# Patient Record
Sex: Female | Born: 1960 | Race: White | Hispanic: No | Smoking: Former smoker
Health system: Southern US, Community
[De-identification: ages and names within clinical notes are randomized; demographics above are authoritative.]

## PROBLEM LIST (undated history)

## (undated) DIAGNOSIS — N200 Calculus of kidney: Secondary | ICD-10-CM

## (undated) DIAGNOSIS — R0789 Other chest pain: Secondary | ICD-10-CM

## (undated) DIAGNOSIS — R002 Palpitations: Secondary | ICD-10-CM

## (undated) DIAGNOSIS — E039 Hypothyroidism, unspecified: Secondary | ICD-10-CM

## (undated) DIAGNOSIS — R42 Dizziness and giddiness: Secondary | ICD-10-CM

## (undated) DIAGNOSIS — D34 Benign neoplasm of thyroid gland: Secondary | ICD-10-CM

## (undated) HISTORY — DX: Palpitations: R00.2

## (undated) HISTORY — PX: OTHER SURGICAL HISTORY: SHX169

## (undated) HISTORY — DX: Benign neoplasm of thyroid gland: D34

## (undated) HISTORY — DX: Dizziness and giddiness: R42

## (undated) HISTORY — DX: Other chest pain: R07.89

## (undated) HISTORY — DX: Hypothyroidism, unspecified: E03.9

## (undated) HISTORY — PX: BREAST CYST ASPIRATION: SHX578

## (undated) HISTORY — DX: Calculus of kidney: N20.0

---

## 1998-06-11 ENCOUNTER — Ambulatory Visit (HOSPITAL_COMMUNITY): Admission: RE | Admit: 1998-06-11 | Discharge: 1998-06-11 | Payer: Self-pay | Admitting: *Deleted

## 1998-06-11 ENCOUNTER — Encounter: Payer: Self-pay | Admitting: *Deleted

## 2000-05-04 ENCOUNTER — Encounter: Payer: Self-pay | Admitting: *Deleted

## 2000-05-04 ENCOUNTER — Ambulatory Visit (HOSPITAL_COMMUNITY): Admission: RE | Admit: 2000-05-04 | Discharge: 2000-05-04 | Payer: Self-pay | Admitting: *Deleted

## 2000-05-21 ENCOUNTER — Encounter: Payer: Self-pay | Admitting: *Deleted

## 2000-05-21 ENCOUNTER — Encounter: Admission: RE | Admit: 2000-05-21 | Discharge: 2000-05-21 | Payer: Self-pay | Admitting: *Deleted

## 2000-12-23 ENCOUNTER — Other Ambulatory Visit: Admission: RE | Admit: 2000-12-23 | Discharge: 2000-12-23 | Payer: Self-pay | Admitting: *Deleted

## 2000-12-23 ENCOUNTER — Encounter (INDEPENDENT_AMBULATORY_CARE_PROVIDER_SITE_OTHER): Payer: Self-pay

## 2001-04-18 ENCOUNTER — Other Ambulatory Visit: Admission: RE | Admit: 2001-04-18 | Discharge: 2001-04-18 | Payer: Self-pay | Admitting: *Deleted

## 2001-05-23 ENCOUNTER — Ambulatory Visit (HOSPITAL_COMMUNITY): Admission: RE | Admit: 2001-05-23 | Discharge: 2001-05-23 | Payer: Self-pay | Admitting: *Deleted

## 2001-05-23 ENCOUNTER — Encounter: Payer: Self-pay | Admitting: *Deleted

## 2002-07-10 ENCOUNTER — Inpatient Hospital Stay (HOSPITAL_COMMUNITY): Admission: AD | Admit: 2002-07-10 | Discharge: 2002-07-13 | Payer: Self-pay | Admitting: *Deleted

## 2002-07-26 ENCOUNTER — Encounter: Admission: RE | Admit: 2002-07-26 | Discharge: 2002-08-25 | Payer: Self-pay | Admitting: *Deleted

## 2002-08-26 ENCOUNTER — Encounter: Admission: RE | Admit: 2002-08-26 | Discharge: 2002-09-25 | Payer: Self-pay | Admitting: *Deleted

## 2002-10-26 ENCOUNTER — Encounter: Admission: RE | Admit: 2002-10-26 | Discharge: 2002-11-25 | Payer: Self-pay | Admitting: *Deleted

## 2002-11-16 ENCOUNTER — Other Ambulatory Visit: Admission: RE | Admit: 2002-11-16 | Discharge: 2002-11-16 | Payer: Self-pay | Admitting: *Deleted

## 2003-05-02 ENCOUNTER — Encounter: Payer: Self-pay | Admitting: Obstetrics & Gynecology

## 2003-05-02 ENCOUNTER — Ambulatory Visit (HOSPITAL_COMMUNITY): Admission: RE | Admit: 2003-05-02 | Discharge: 2003-05-02 | Payer: Self-pay | Admitting: Obstetrics & Gynecology

## 2003-12-04 ENCOUNTER — Ambulatory Visit (HOSPITAL_COMMUNITY): Admission: RE | Admit: 2003-12-04 | Discharge: 2003-12-04 | Payer: Self-pay | Admitting: Obstetrics & Gynecology

## 2003-12-06 ENCOUNTER — Encounter (INDEPENDENT_AMBULATORY_CARE_PROVIDER_SITE_OTHER): Payer: Self-pay | Admitting: Specialist

## 2003-12-06 ENCOUNTER — Inpatient Hospital Stay (HOSPITAL_COMMUNITY): Admission: RE | Admit: 2003-12-06 | Discharge: 2003-12-12 | Payer: Self-pay | Admitting: Obstetrics

## 2003-12-13 ENCOUNTER — Encounter: Admission: RE | Admit: 2003-12-13 | Discharge: 2004-01-12 | Payer: Self-pay | Admitting: Obstetrics

## 2004-01-13 ENCOUNTER — Encounter: Admission: RE | Admit: 2004-01-13 | Discharge: 2004-02-12 | Payer: Self-pay | Admitting: Obstetrics

## 2004-03-14 ENCOUNTER — Encounter: Admission: RE | Admit: 2004-03-14 | Discharge: 2004-04-13 | Payer: Self-pay | Admitting: Obstetrics

## 2004-04-14 ENCOUNTER — Encounter: Admission: RE | Admit: 2004-04-14 | Discharge: 2004-05-14 | Payer: Self-pay | Admitting: Obstetrics

## 2004-05-29 ENCOUNTER — Ambulatory Visit (HOSPITAL_COMMUNITY): Admission: RE | Admit: 2004-05-29 | Discharge: 2004-05-29 | Payer: Self-pay | Admitting: Obstetrics

## 2004-06-14 ENCOUNTER — Encounter: Admission: RE | Admit: 2004-06-14 | Discharge: 2004-07-14 | Payer: Self-pay | Admitting: Obstetrics

## 2004-09-22 ENCOUNTER — Encounter: Admission: RE | Admit: 2004-09-22 | Discharge: 2004-09-22 | Payer: Self-pay | Admitting: Obstetrics

## 2005-03-02 ENCOUNTER — Ambulatory Visit (HOSPITAL_COMMUNITY): Admission: RE | Admit: 2005-03-02 | Discharge: 2005-03-02 | Payer: Self-pay | Admitting: Family Medicine

## 2005-04-30 ENCOUNTER — Other Ambulatory Visit: Admission: RE | Admit: 2005-04-30 | Discharge: 2005-04-30 | Payer: Self-pay | Admitting: Obstetrics and Gynecology

## 2005-10-11 ENCOUNTER — Emergency Department (HOSPITAL_COMMUNITY): Admission: EM | Admit: 2005-10-11 | Discharge: 2005-10-11 | Payer: Self-pay | Admitting: Emergency Medicine

## 2006-02-04 ENCOUNTER — Encounter: Admission: RE | Admit: 2006-02-04 | Discharge: 2006-02-04 | Payer: Self-pay | Admitting: Obstetrics and Gynecology

## 2006-05-06 ENCOUNTER — Other Ambulatory Visit: Admission: RE | Admit: 2006-05-06 | Discharge: 2006-05-06 | Payer: Self-pay | Admitting: Obstetrics and Gynecology

## 2006-10-27 ENCOUNTER — Ambulatory Visit (HOSPITAL_COMMUNITY): Admission: RE | Admit: 2006-10-27 | Discharge: 2006-10-27 | Payer: Self-pay | Admitting: Family Medicine

## 2006-11-04 ENCOUNTER — Encounter (INDEPENDENT_AMBULATORY_CARE_PROVIDER_SITE_OTHER): Payer: Self-pay | Admitting: Specialist

## 2006-11-04 ENCOUNTER — Other Ambulatory Visit: Admission: RE | Admit: 2006-11-04 | Discharge: 2006-11-04 | Payer: Self-pay | Admitting: Interventional Radiology

## 2006-11-04 ENCOUNTER — Encounter: Admission: RE | Admit: 2006-11-04 | Discharge: 2006-11-04 | Payer: Self-pay | Admitting: Family Medicine

## 2007-02-07 ENCOUNTER — Encounter: Admission: RE | Admit: 2007-02-07 | Discharge: 2007-02-07 | Payer: Self-pay | Admitting: Obstetrics and Gynecology

## 2007-02-28 ENCOUNTER — Encounter: Admission: RE | Admit: 2007-02-28 | Discharge: 2007-02-28 | Payer: Self-pay | Admitting: Endocrinology

## 2007-05-20 ENCOUNTER — Other Ambulatory Visit: Admission: RE | Admit: 2007-05-20 | Discharge: 2007-05-20 | Payer: Self-pay | Admitting: Obstetrics and Gynecology

## 2007-08-18 ENCOUNTER — Encounter: Admission: RE | Admit: 2007-08-18 | Discharge: 2007-08-18 | Payer: Self-pay | Admitting: Endocrinology

## 2008-02-14 ENCOUNTER — Encounter: Admission: RE | Admit: 2008-02-14 | Discharge: 2008-02-14 | Payer: Self-pay | Admitting: Obstetrics and Gynecology

## 2008-05-21 ENCOUNTER — Other Ambulatory Visit: Admission: RE | Admit: 2008-05-21 | Discharge: 2008-05-21 | Payer: Self-pay | Admitting: Obstetrics and Gynecology

## 2008-07-06 ENCOUNTER — Encounter: Admission: RE | Admit: 2008-07-06 | Discharge: 2008-07-06 | Payer: Self-pay | Admitting: Internal Medicine

## 2009-02-18 ENCOUNTER — Encounter: Admission: RE | Admit: 2009-02-18 | Discharge: 2009-02-18 | Payer: Self-pay | Admitting: Obstetrics and Gynecology

## 2009-06-11 ENCOUNTER — Other Ambulatory Visit: Admission: RE | Admit: 2009-06-11 | Discharge: 2009-06-11 | Payer: Self-pay | Admitting: Obstetrics and Gynecology

## 2010-02-19 ENCOUNTER — Encounter: Admission: RE | Admit: 2010-02-19 | Discharge: 2010-02-19 | Payer: Self-pay | Admitting: Obstetrics and Gynecology

## 2010-07-02 ENCOUNTER — Encounter
Admission: RE | Admit: 2010-07-02 | Discharge: 2010-07-02 | Payer: Self-pay | Source: Home / Self Care | Admitting: Endocrinology

## 2010-11-13 ENCOUNTER — Encounter: Payer: Self-pay | Admitting: Cardiovascular Disease

## 2010-11-14 ENCOUNTER — Encounter: Payer: Self-pay | Admitting: Cardiovascular Disease

## 2010-11-14 ENCOUNTER — Ambulatory Visit (INDEPENDENT_AMBULATORY_CARE_PROVIDER_SITE_OTHER): Payer: BC Managed Care – PPO | Admitting: Cardiovascular Disease

## 2010-11-14 DIAGNOSIS — R079 Chest pain, unspecified: Secondary | ICD-10-CM | POA: Insufficient documentation

## 2010-11-14 DIAGNOSIS — R002 Palpitations: Secondary | ICD-10-CM

## 2010-11-14 NOTE — Patient Instructions (Signed)
Your physician has requested that you have an echocardiogram. Echocardiography is a painless test that uses sound waves to create images of your heart. It provides your doctor with information about the size and shape of your heart and how well your heart's chambers and valves are working. This procedure takes approximately one hour. There are no restrictions for this procedure.  Your physician has requested that you have an exercise tolerance test. For further information please visit [email or web address]. Please also follow instruction sheet, as given.   

## 2010-11-14 NOTE — Assessment & Plan Note (Signed)
Atypical chest pain. EKG is normal. She also has dizziness. She would like to start HRT. Will arrange echo and exercise treadmill stress test.

## 2010-11-14 NOTE — Progress Notes (Signed)
History of Present Illness:50 yo female with history of hypothyroidism, thyroid cysts, GERD here today for cardiac evaluation. She has no prior cardiac issues. She is going through menopause. She has occasional episodes of dizziness. She was a smoker for 20 years. She currently smokes several cigarettes per week. Her husband recently had a bad accident and has a spinal cord injury. She also has episodes of flushing, chest pressure and palpitations. Her chest pressure is centrally located and does not radiate. No associated symptoms. 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 She is also having symptoms of menopause and has recently discussed HRT with her primary care provider.   Past Medical History  Diagnosis Date  . Dizziness   . Chest pressure   . Palpitations   . Hypothyroidism     No past surgical history on file.  Current Outpatient Prescriptions  Medication Sig Dispense Refill  . calcium carbonate 200 MG capsule Take 250 mg by mouth daily.        . fish oil-omega-3 fatty acids 1000 MG capsule Take 1 g by mouth daily.        . Multiple Vitamin (MULTIVITAMIN) tablet Take 1 tablet by mouth daily.        . pantoprazole (PROTONIX) 40 MG tablet Take 40 mg by mouth daily.          No Known Allergies  History   Social History  . Marital Status: Married    Spouse Name: N/A    Number of Children: N/A  . Years of Education: N/A   Occupational History  . Not on file.   Social History Main Topics  . Smoking status: Former Games developer  . Smokeless tobacco: Not on file   Comment: smoke occasionally  . Alcohol Use: Not on file  . Drug Use: Not on file  . Sexually Active: Not on file   Other Topics Concern  . Not on file   Social History Narrative  . No narrative on file    No family history on file.  Review of Systems:  As stated in the HPI and otherwise negative.   BP 110/70  Pulse 65  Resp 14  Ht 5\' 6"  (1.676 m)  Wt 130 lb (58.968 kg)  BMI 20.98 kg/m2  Physical  Examination: General: Well developed, well nourished, NAD HEENT: OP clear, mucus membranes moist SKIN: warm, dry. No rashes. Neuro: No focal deficits Musculoskeletal: Muscle strength 5/5 all ext Psychiatric: Mood and affect normal Neck: No JVD, no carotid bruits, no thyromegaly, no lymphadenopathy. Lungs:Clear bilaterally, no wheezes, rhonci, crackles Cardiovascular: Regular rate and rhythm. No murmurs, gallops or rubs. Abdomen:Soft. Bowel sounds present. Non-tender.  Extremities: No lower extremity edema. Pulses are 2 + in the bilateral DP/PT.  EKG:NSR rate 65 bpm. Normal EKG.

## 2010-11-25 ENCOUNTER — Ambulatory Visit (INDEPENDENT_AMBULATORY_CARE_PROVIDER_SITE_OTHER): Payer: BC Managed Care – PPO | Admitting: Cardiovascular Disease

## 2010-11-25 ENCOUNTER — Ambulatory Visit (HOSPITAL_COMMUNITY): Payer: BC Managed Care – PPO | Attending: Cardiovascular Disease | Admitting: Radiology

## 2010-11-25 DIAGNOSIS — R079 Chest pain, unspecified: Secondary | ICD-10-CM | POA: Insufficient documentation

## 2010-11-25 DIAGNOSIS — R072 Precordial pain: Secondary | ICD-10-CM

## 2010-11-25 DIAGNOSIS — R002 Palpitations: Secondary | ICD-10-CM | POA: Insufficient documentation

## 2010-11-25 NOTE — Progress Notes (Signed)
Exercise Treadmill Test  Pre-Exercise Testing Evaluation Rhythm: sinus bradycardia  Rate: 52   PR:  .13 QRS:  .08  QT:  .45 QTc: .42     Test  Exercise Tolerance Test Ordering MD: Melene Muller, MD  Interpreting MD:  Melene Muller, MD  Unique Test No: 1  Treadmill:  1  Indication for ETT: chest pain - rule out ischemia  Contraindication to ETT: No   Stress Modality: exercise - treadmill  Cardiac Imaging Performed: non   Protocol: standard Bruce - maximal  Max BP:  165/78  Max MPHR (bpm):  173 85% MPR (bpm):  147  155 % MPHR obtained:  89  Reached 85% MPHR (min:sec): 9:07 Total Exercise Time (min-sec):10:30  Workload in METS:  12.5 Borg Scale:17  Reason ETT Terminated:  fatigue    ST Segment Analysis At Rest: normal ST segments - no evidence of significant ST depression With Exercise: no evidence of significant ST depression  Other Information Arrhythmia:  Yes Angina during ETT:  absent (0) Quality of ETT:  non-diagnostic  ETT Interpretation:  normal - no evidence of ischemia by ST analysis  Comments: Pt exercised for 10:30 minutes with no chest pain. Rare PVCs with exercise. No ischemic EKG changes.   Recommendations: Normal stress test.  Echo with normal LV function. No further cardiac workup indicated. Proceed with hormone replacement therapy if indicated.

## 2010-12-12 NOTE — Discharge Summary (Signed)
NAME:  Yvonne Strickland, Yvonne Strickland                       ACCOUNT NO.:  [id number]   MEDICAL RECORD NO.:  [id number]                   PATIENT TYPE:  INP   LOCATION:  9108                                 FACILITY:  [facility]   PHYSICIAN:  Charles A. Clearance Coots, M.D.             DATE OF BIRTH:  21-Jun-1961   DATE OF ADMISSION:  12/06/2003  DATE OF DISCHARGE:  12/12/2003                                 DISCHARGE SUMMARY   ADMITTING DIAGNOSES:  1. Thirty-nine weeks gestation.  2. Breech presentation.  3. Desires permanent sterilization at time of cesarean section.   DISCHARGE DIAGNOSES:  1. Postoperative day #6, status post cesarean delivery for a viable female     newborn.  2. Bilateral tubal ligation.  3. Acute blood loss anemia, status post postoperative bleed from tubal     ligation.   PATIENT PRESENTATION:  The patient presented to the [facility] on Dec 06, 2003 for cesarean section to the preop unit.  The patient's cesarean  section indication was for a breech presentation at 39 weeks.  The patient  also desired bilateral tubal ligation at the time of cesarean delivery for  permanent sterilization.   PATIENT HISTORY:  The patient is a 50 year old, gravida 4, para 1-0-2-1,  with a last menstrual period of March 03, 2003, with a due date of Dec 02, 2003, Dec 08, 2003 final [facility].   ALLERGIES:  The patient has no known drug allergies.   CURRENT MEDICATIONS:  Prenatal vitamins once daily.   PAST SURGICAL HISTORY:  The patient has no history of surgery.   The patient has been treated in the past for Chlamydia in 1986.  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   PRENATAL COURSE:  The patient has received prenatal care at the [facility] since September of 2004.  The patient is A positive.  Antibody screen is negative.  Rubella titer is immune.  Hepatitis is  negative.  HIV is nonreactive.  The patient has a positive RPR with a  negative TPPA.  The  patient is at risk during her pregnancy due to advanced  maternal age at 50 years of age.  The patient received genetic counseling.  Elected not to proceed with an amniocentesis.  All genetic testing conducted  at [facility] within normal limits.   HOSPITAL COURSE:  The patient had a cesarean section on Dec 06, 2003.  Please see operative note by Dr. Coral Ceo.   On postoperative day #1, Dec 07, 2003, the patient complained of pain in the  right shoulder and significant bruising on the abdomen.  The patient is  noted to have significant acute blood loss anemia with a hemoglobin of now  5.9.  Preoperative hemoglobin was 12.9, with a hematocrit of 38.5, and a  platelet count of 164.  The patient's abdomen is soft, mildly distended,  with a large area of ecchymosis.  The patient has positive perineal  tenderness and pain into her right shoulder.   On postoperative day #2, Dec 08, 2003, evaluation by Dr. Clearance Coots for  postoperative tubal bleeding from tubal ligation site, with noted hematoma.  The patient is having serial hemoglobins at this point, with a hemoglobin  today of 5.6.  The patient is hematologically stable, is not orthostatic,  and is asymptomatic.  The patient is taking analgesia for postoperative  pain, and for shoulder discomfort.   On postoperative day #3, the patient is hemodynamically stable.  No  complaints of dizziness with activity.  Hemoglobin is otherwise to 4.8.  Evaluation by Dr. Clearance Coots, and discussion with patient and family, to  consider blood transfusion or return to the surgical suite for exploration  of fossal hematoma if hemoglobin continues to drop from this point.  Otherwise, the patient is progressing well postoperatively.  She is  tolerating p.o. intake without any difficulty.  She is voiding without  difficulty, passing gas, and has had a positive bowel movement.   On postoperative day #4, hemoglobin is 5.2.  The patient has reached   equilibrium with her hemoglobin.  The patient still has marked ecchymosis  over the majority of abdomen and into the mons and labia.  Otherwise, the  patient reports no increase in pain, and is in stable condition to continue  observation of hemoglobin.  Will defer transfusion or exploratory surgery at  this point.   On postoperative day #5, hemoglobin is now 5.4, is stable.  The patient is  tolerating diet without any problems.  The patient is now also taking iron  three times a day.  Evaluate for discharge tomorrow if hemoglobin remains  stable.   On postoperative day #6, hemoglobin is now 7.1 with a hematocrit of 20.9,  and a platelet count of 358.  The patient is up ad lib and asymptomatic.  Incision is well approximated with subcuticular suture.  No drainage is  present.  The patient is discharged home in stable condition to continue  rest at home.   FOLLOW UP:  To follow up in the office in 2 days to repeat hemoglobin.  The  patient is to call with any dizziness, increase in bleeding, or symptoms.   MEDICATIONS FOR DISCHARGE:  1. Darvocet-N 100 q.4h. as needed for discomfort.  2. Vicodin q.6-8h. as needed for pain unrelieved with Darvocet.  3. Prenatal vitamin is Prenate Elite one tablet daily.  4. Niferex-150 one tablet twice a day.  5. Colace 100 mg one tablet daily to prevent constipation.   DISCHARGE INSTRUCTIONS:  1. The patient to air dry incision often.  2. Ice to labia for comfort and bruising.  3. The patient to rest at home.  4. No heavy lifting.  5. To return to the [facility] as scheduled in approximately 2     days.   DIET:  The patient is on a regular diet.   WOUND CARE:  Routine.     Marlinda Mike, C.N.M.                      Charles A. Clearance Coots, M.D.   TB/MEDQ  D:  01/23/2004  T:  01/23/2004  Job:  [id number]

## 2010-12-12 NOTE — Op Note (Signed)
NAME:  Yvonne Strickland, Yvonne Strickland                       ACCOUNT NO.:  [id number]   MEDICAL RECORD NO.:  [id number]                   PATIENT TYPE:  INP   LOCATION:  9108                                 FACILITY:  [facility]   PHYSICIAN:  Charles A. Clearance Coots, M.D.             DATE OF BIRTH:  10-Mar-1961   DATE OF PROCEDURE:  12/06/2003  DATE OF DISCHARGE:                                 OPERATIVE REPORT   PREOPERATIVE DIAGNOSES:  1. At [redacted] weeks gestation.  2. Breech presentation.  3. Desires sterilization.   POSTOPERATIVE DIAGNOSES:  1. At [redacted] weeks gestation.  2. Breech presentation.  3. Desires sterilization.   PROCEDURE:  Primary low transverse cesarean section, bilateral partial  salpingectomy (Pomeroy technique).   SURGEON:  Charles A. Clearance Coots, M.D.   ASSISTANT:  Charlsie Merles.   ANESTHESIA:  Spinal.   ESTIMATED BLOOD LOSS:  800 mL.   IV FLUIDS:  3700 mL.   URINE OUT:  350 mL clear.   COMPLICATIONS:  None.   Foley catheter to gravity.   FINDINGS:  Viable female at 0906, Apgars of 8 at one minute and 9 at five  minutes.  Normal uterus, ovaries and fallopian tubes.   SPECIMENS:  Approximately 2 cm segments of left and right fallopian tube.   DESCRIPTION OF PROCEDURE:  The patient is brought to the operating room and  after satisfactory spinal anesthesia, the abdomen was prepped and draped in  the usual sterile fashion.  Pfannenstiel skin incision was made with the  scalpel that was deepened down to the fascia with the scalpel.  Fascia was  nicked in the midline and the fascial incision was extended to the left and  to the right with curved Mayo scissors.  The superior and inferior fascial  edges were then taken off of the rectus muscle with blunt and sharp  dissection.  The rectus muscle was bluntly and sharply divided in the  midline.  The peritoneum was then entered digitally.  It was digitally  extended to the left and to the right.  The bladder blade was positioned and  the  vesicouterine fold of peritoneum above the reflection of urinary bladder  was grasped with forceps and was incised and undermined with Metzenbaum  scissors. The incision was extended to the left and to the right with  Metzenbaum scissors.  The bladder flap was bluntly developed and the bladder  blade was repositioned in front of the urinary bladder, placing it well out  of the operative field.  Uterus was then entered with short strokes of the  scalpel and clear amniotic fluid was expelled.  The uterine incision was  extended to the left and to the right with bandage scissors.  The deliver  was accomplished in routine fashion as a frank breech extraction.  The  infant's mouth and nose were suctioned with a suction bulb and the cord was  doubly clamped and cut and the infant  was handed off to nursery staff.  Cord  blood was obtained and placenta was spontaneously expelled from the uterine  cavity intact.  The uterus was exteriorized and the endometrial surface was  thoroughly debrided with a dry lap sponge.  The edges of the uterine  incision were grasped with ring forceps and the uterus was closed with  continuous suture of 0 Monocryl, each going up to the center.  Hemostasis  was excellent.  Attention was then turned above to the tubal ligation  procedure.  The right fallopian tube was grasped with the Babcock clamp and  was followed from __________ end to the fimbrial end and grasped with  Babcock clamp.  A knuckle of tube beneath the Babcock clamp and the isthmic  area.  The tube was doubly ligated with #1 plain catgut.  A section of tube  above the knot was excised and submitted to pathology.  There was no active  bleeding from the tubal stumps.  The same procedure was performed on the  opposite side without complications.  The uterus was then placed back in its  normal anatomic position.  The pelvic cavity was then thoroughly irrigated  with warm saline solution and all clots were  removed.  The abdomen was then  closed as follows:  peritoneum was closed with continuous suture of 2-0  Monocryl, the fascia was closed with continuous suture of 0 PDS from each  corner to the center.  Subcutaneous tissue was thoroughly irrigated with  warm saline solution and all areas of subcutaneous bleeding were coagulated  with the Bovie.  Skin was then approximated with continuous subcuticular  suture of 3-0 Monocryl.  Sterile bandages applied to the incision closure.  Surgical technician indicated that all sponge, needle and instrument counts  were correct.  The patient tolerated the procedure well and was transported  to the recovery room in satisfactory condition.                                               Charles A. Clearance Coots, M.D.    CAH/MEDQ  D:  12/06/2003  T:  12/06/2003  Job:  [id number]

## 2011-03-31 ENCOUNTER — Other Ambulatory Visit: Payer: Self-pay | Admitting: Obstetrics and Gynecology

## 2011-03-31 DIAGNOSIS — Z1231 Encounter for screening mammogram for malignant neoplasm of breast: Secondary | ICD-10-CM

## 2011-04-10 ENCOUNTER — Ambulatory Visit
Admission: RE | Admit: 2011-04-10 | Discharge: 2011-04-10 | Disposition: A | Discharge status: Home / Self Care | Payer: BC Managed Care – PPO | Source: Ambulatory Visit | Attending: Obstetrics and Gynecology | Admitting: Obstetrics and Gynecology

## 2011-04-10 DIAGNOSIS — Z1231 Encounter for screening mammogram for malignant neoplasm of breast: Secondary | ICD-10-CM

## 2012-04-25 ENCOUNTER — Other Ambulatory Visit: Payer: Self-pay | Admitting: Obstetrics and Gynecology

## 2012-04-25 DIAGNOSIS — Z1231 Encounter for screening mammogram for malignant neoplasm of breast: Secondary | ICD-10-CM

## 2012-05-17 ENCOUNTER — Ambulatory Visit
Admission: RE | Admit: 2012-05-17 | Discharge: 2012-05-17 | Disposition: A | Discharge status: Home / Self Care | Payer: BC Managed Care – PPO | Source: Ambulatory Visit | Attending: Obstetrics and Gynecology | Admitting: Obstetrics and Gynecology

## 2012-05-17 DIAGNOSIS — Z1231 Encounter for screening mammogram for malignant neoplasm of breast: Secondary | ICD-10-CM

## 2012-07-27 DIAGNOSIS — D34 Benign neoplasm of thyroid gland: Secondary | ICD-10-CM

## 2012-07-27 HISTORY — DX: Benign neoplasm of thyroid gland: D34

## 2012-09-16 ENCOUNTER — Other Ambulatory Visit: Payer: Self-pay | Admitting: Endocrinology

## 2012-09-16 DIAGNOSIS — E049 Nontoxic goiter, unspecified: Secondary | ICD-10-CM

## 2012-09-19 ENCOUNTER — Other Ambulatory Visit: Payer: Self-pay | Admitting: Physician Assistant

## 2012-09-19 ENCOUNTER — Other Ambulatory Visit: Payer: Self-pay | Admitting: Endocrinology

## 2012-09-19 DIAGNOSIS — N959 Unspecified menopausal and perimenopausal disorder: Secondary | ICD-10-CM

## 2012-09-22 ENCOUNTER — Ambulatory Visit
Admission: RE | Admit: 2012-09-22 | Discharge: 2012-09-22 | Disposition: A | Discharge status: Home / Self Care | Payer: BC Managed Care – PPO | Source: Ambulatory Visit | Attending: Endocrinology | Admitting: Endocrinology

## 2012-09-22 DIAGNOSIS — E049 Nontoxic goiter, unspecified: Secondary | ICD-10-CM

## 2012-09-28 ENCOUNTER — Other Ambulatory Visit: Payer: Self-pay | Admitting: Endocrinology

## 2012-10-07 ENCOUNTER — Other Ambulatory Visit: Payer: BC Managed Care – PPO

## 2012-10-11 ENCOUNTER — Other Ambulatory Visit: Payer: BC Managed Care – PPO

## 2012-12-12 ENCOUNTER — Encounter: Payer: Self-pay | Admitting: *Deleted

## 2012-12-13 ENCOUNTER — Encounter: Payer: Self-pay | Admitting: Nurse Practitioner

## 2012-12-13 ENCOUNTER — Ambulatory Visit
Admission: RE | Admit: 2012-12-13 | Discharge: 2012-12-13 | Disposition: A | Discharge status: Home / Self Care | Payer: BC Managed Care – PPO | Source: Ambulatory Visit | Attending: Endocrinology | Admitting: Endocrinology

## 2012-12-13 ENCOUNTER — Ambulatory Visit (INDEPENDENT_AMBULATORY_CARE_PROVIDER_SITE_OTHER): Payer: BC Managed Care – PPO | Admitting: Nurse Practitioner

## 2012-12-13 VITALS — BP 104/60 | HR 68 | Resp 14 | Ht 66.0 in | Wt 135.2 lb

## 2012-12-13 DIAGNOSIS — Z01419 Encounter for gynecological examination (general) (routine) without abnormal findings: Secondary | ICD-10-CM

## 2012-12-13 DIAGNOSIS — Z0000 Encounter for general adult medical examination without abnormal findings: Secondary | ICD-10-CM

## 2012-12-13 DIAGNOSIS — N959 Unspecified menopausal and perimenopausal disorder: Secondary | ICD-10-CM

## 2012-12-13 LAB — POCT URINALYSIS DIPSTICK
Leukocytes, UA: NEGATIVE
Spec Grav, UA: 1.02
Urobilinogen, UA: NEGATIVE
pH, UA: 6

## 2012-12-13 MED ORDER — ESTRADIOL 0.025 MG/24HR TD PTTW: 1.0000 | MEDICATED_PATCH | TRANSDERMAL | Status: DC

## 2012-12-13 MED ORDER — ESTRADIOL-NORETHINDRONE ACET 0.05-0.25 MG/DAY TD PTTW: 1.0000 | MEDICATED_PATCH | TRANSDERMAL | Status: DC

## 2012-12-13 NOTE — Patient Instructions (Addendum)

## 2012-12-13 NOTE — Progress Notes (Signed)
52 y.o. G4P2 Married Caucasian Fe here for annual exam.  On combination of Vivelle dot for two weeks and Combipatch for two weeks. Spotting every 2-3 months.  Usually very light and using light tampon 1-2 days. Some PMS and breast tenderness. 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 Still very busy schedule.  Husband who is disabled has his own issues with depression.  Patient's last menstrual period was 07/27/2008.          Sexually active: no  The current method of family planning is post menopausal status.    Exercising: yes  Home exercise routine includes walking 5-6 hrs per week. Smoker:  no  Health Maintenance: Pap:  10/15/2011  Normal with negative HR HPV MMG:  05/17/2012  Normal  Colonoscopy:  never BMD:   12/13/2012 T Score: Spine -2.0, left femur neck -0.8   (Will see Dr Lurene Shadow in August and to review results in conjunction with thyroid nodule and labs.) TDaP:  Within last 10 years, per pt.  Labs: Hgb- 13.7   reports that she has quit smoking. She does not have any smokeless tobacco history on file.  Past Medical History  Diagnosis Date  . Dizziness   . Chest pressure   . Palpitations   . Hypothyroidism   . Renal calculi   . Thyroid tumor, benign 2014    Past Surgical History  Procedure Laterality Date  . C-section x 1      Current Outpatient Prescriptions  Medication Sig Dispense Refill  . calcium carbonate 200 MG capsule Take 250 mg by mouth daily.        Marland Kitchen estradiol (VIVELLE-DOT) 0.025 MG/24HR Place 1 patch onto the skin 2 (two) times a week.      . fish oil-omega-3 fatty acids 1000 MG capsule Take 1 g by mouth daily.        . Multiple Vitamin (MULTIVITAMIN) tablet Take 1 tablet by mouth daily.        . pantoprazole (PROTONIX) 40 MG tablet Take 40 mg by mouth daily.        . Vitamin D, Ergocalciferol, (DRISDOL) 50000 UNITS CAPS Take 50,000 Units by mouth every 7 (seven) days.      Marland Kitchen aspirin 81 MG tablet Take 81 mg by mouth  daily.       No current facility-administered medications for this visit.    History reviewed. No pertinent family history.  ROS:  Pertinent items are noted in HPI.  Otherwise, a comprehensive ROS was negative.  Exam:   BP 104/60  Pulse 68  Resp 14  Ht 5\' 6"  (1.676 m)  Wt 135 lb 3.2 oz (61.326 kg)  BMI 21.83 kg/m2  LMP 07/27/2008 Height: 5\' 6"  (167.6 cm)  Ht Readings from Last 3 Encounters:  12/13/12 5\' 6"  (1.676 m)  11/14/10 5\' 6"  (1.676 m)    General appearance: alert, cooperative and appears stated age Head: Normocephalic, without obvious abnormality, atraumatic Neck: no adenopathy, supple, symmetrical, trachea midline and thyroid nodule on the left Lungs: clear to auscultation bilaterally Breasts: normal appearance, no masses or tenderness Heart: regular rate and rhythm Abdomen: soft, non-tender; no masses,  no organomegaly Extremities: extremities normal, atraumatic, no cyanosis or edema Skin: Skin color, texture, turgor normal. No rashes or lesions Lymph nodes: Cervical, supraclavicular, and axillary nodes normal. No abnormal inguinal nodes palpated Neurologic: Grossly normal   Pelvic: External genitalia:  no lesions  Urethra:  normal appearing urethra with no masses, tenderness or lesions              Bartholin's and Skene's: normal                 Vagina: normal appearing vagina with normal color and discharge, no lesions              Cervix: anteverted              Pap taken: no Bimanual Exam:  Uterus:  normal size, contour, position, consistency, mobility, non-tender              Adnexa: no mass, fullness, tenderness               Rectovaginal: Confirms               Anus:  normal sphincter tone, no lesions  A:  Well Woman with normal exam  Postmenopausal with HRT combination therapy to reduce BTB  Situational depression and stressors with husbands health issues  Osteopenia on most recent BMD - will be followed by Dr. Lurene Shadow  Thyroid nodule  enlarging and will be followed by Dr. Lurene Shadow   P:   Pap smear as per guidelines   Mammogram due 04/2013  She will have further labs done with Dr. Lurene Shadow concerning her latest BMD reading  Discussed continued HRT treatment vs. potential risk and side effects such as DVT, CVA, cancer, etc.  Pt wished to continue, new refill on med's.  return annually or prn  An After Visit Summary was printed and given to the patient.

## 2012-12-20 ENCOUNTER — Telehealth: Payer: Self-pay | Admitting: *Deleted

## 2012-12-20 NOTE — Telephone Encounter (Signed)
Message copied by Osie Bond on Tue Dec 20, 2012  2:57 PM ------      Message from: Ria Comment R      Created: Tue Dec 20, 2012  1:20 PM       Patient does show osteopenia at her spine and normal at her hip site.  This may be due to her history of hyperthyroid and premature menopause.  Have her to do upper body weight and exercie 3-4 times a week.  She must continue with HRT, calcium, and vitamin D. ------

## 2012-12-20 NOTE — Telephone Encounter (Signed)
LVM for pt to return my call in regards to DEXA results.

## 2012-12-22 NOTE — Progress Notes (Signed)
Encounter reviewed by Dr. Margarita Bobrowski Silva.  

## 2013-03-27 HISTORY — PX: BIOPSY THYROID: PRO38

## 2013-04-03 ENCOUNTER — Ambulatory Visit
Admission: RE | Admit: 2013-04-03 | Discharge: 2013-04-03 | Disposition: A | Discharge status: Home / Self Care | Payer: BC Managed Care – PPO | Source: Ambulatory Visit | Attending: Endocrinology | Admitting: Endocrinology

## 2013-04-03 DIAGNOSIS — E049 Nontoxic goiter, unspecified: Secondary | ICD-10-CM

## 2013-04-06 ENCOUNTER — Other Ambulatory Visit: Payer: Self-pay | Admitting: Endocrinology

## 2013-04-06 DIAGNOSIS — E041 Nontoxic single thyroid nodule: Secondary | ICD-10-CM

## 2013-04-11 ENCOUNTER — Other Ambulatory Visit (HOSPITAL_COMMUNITY)
Admission: RE | Admit: 2013-04-11 | Discharge: 2013-04-11 | Disposition: A | Discharge status: Home / Self Care | Payer: BC Managed Care – PPO | Source: Ambulatory Visit | Attending: Interventional Radiology | Admitting: Interventional Radiology

## 2013-04-11 ENCOUNTER — Ambulatory Visit
Admission: RE | Admit: 2013-04-11 | Discharge: 2013-04-11 | Disposition: A | Discharge status: Home / Self Care | Payer: BC Managed Care – PPO | Source: Ambulatory Visit | Attending: Endocrinology | Admitting: Endocrinology

## 2013-04-11 DIAGNOSIS — E041 Nontoxic single thyroid nodule: Secondary | ICD-10-CM

## 2013-05-09 ENCOUNTER — Telehealth: Payer: Self-pay | Admitting: Nurse Practitioner

## 2013-05-09 NOTE — Telephone Encounter (Signed)
Thank you.  OK to wait until 10/20 for this appointment.

## 2013-05-09 NOTE — Telephone Encounter (Signed)
Last Mammogram : 05/17/2012-RECOMMENDATION: Screening mammogram in one year. Patient usually sees Lauro Franklin, FNP.   Patient calling breast center and states that she is having pain in R breast. Does not feel anything on breast exam. Advised will need OV to check R breast and order for testing. Patient is agreeable. Scheduled OV with Dr. Edward Jolly (Patient declines earlier appointment, she will be out of town for work until Monday) and requests OV for 10/20.

## 2013-05-09 NOTE — Telephone Encounter (Signed)
Patient is calling because she needs an order for a diagnostic mammogram sent to.The [facility] [place].

## 2013-05-15 ENCOUNTER — Ambulatory Visit (INDEPENDENT_AMBULATORY_CARE_PROVIDER_SITE_OTHER): Payer: BC Managed Care – PPO | Admitting: Obstetrics and Gynecology

## 2013-05-15 ENCOUNTER — Encounter: Payer: Self-pay | Admitting: Obstetrics and Gynecology

## 2013-05-15 VITALS — BP 100/58 | HR 60 | Ht 66.75 in | Wt 136.0 lb

## 2013-05-15 DIAGNOSIS — N644 Mastodynia: Secondary | ICD-10-CM

## 2013-05-15 NOTE — Patient Instructions (Signed)
You will receive you mammogram and ultrasound results immediately at the Breast Center on the day of your visit there!

## 2013-05-15 NOTE — Progress Notes (Signed)
Appointment made for bilateral dx  Mammogram and R Breast U/S. Appointment made with patient in office for  10/29 at 0800 at the [facility] imaging patient agreeable to time/date.

## 2013-05-15 NOTE — Progress Notes (Signed)
Patient ID: Yvonne Strickland, female   DOB: 05/27/1961, 52 y.o.   MRN: [id number] GYNECOLOGY PROBLEM VISIT  PCP:  Tally Joe, MD  Referring provider:   HPI: 52 y.o.   Married  Caucasian  female   G4P0020 with Patient's last menstrual period was 07/27/2008.   here for  Right breast pain for 2 months.  [facility]. Right medial breast pain and pain in the the nipple area. Notes increased breast tissue and 10 pounds of weight since starting HRT. On HRT for about 2 years.  No correlation with use of Combipatch versus the Vivelle alone, uses each for two weeks.  (Has never done Combipatch alone.) Patient has a nondescript pain in the right chest area so has had a CXR through Dr. Azucena Cecil, which was normal. Patient states that she had palpitations when she takes a lot of progesterone, occurred with cream and pill form.  Patient likes the current HRT regimen she is on.  Feels emotional when she is on the combipatch, so doe not want to do this every week of the month.    Patient is having bleeding every month, when takes the Combipatch.  Very light bleeding every third week.  Does not last long.  Is very predictable.    GYNECOLOGIC HISTORY: Patient's last menstrual period was 07/27/2008.Pt. Notes spotting monthly with hormones. Sexually active: no   Partner preference: postmenopausal Contraception:  tubal Menopausal hormone therapy: Vivelle Dot and Combipatch DES exposure:  no Blood transfusions: no  Sexually transmitted diseases:   no GYN Procedures:  Tubal, C-section Mammogram:   04/2012 [facility]             Pap:  10-15-11 wnl:neg HR HPV  History of abnormal pap smear:  20years ago but no treatment to cervix.  Paps reverted to normal.   OB History   Grav Para Term Preterm Abortions TAB SAB Ect Mult Living   4 2   2  2             No family history on file.  Patient Active Problem List   Diagnosis Date Noted  . Chest pain  11/14/2010    Past Medical History  Diagnosis Date  . Dizziness   . Chest pressure   . Palpitations   . Renal calculi   . Hypothyroidism   . Thyroid tumor, benign 2014    Past Surgical History  Procedure Laterality Date  . C-section x 1    . Biopsy thyroid  03/2013    --benign--Dr. Talmage Nap    ALLERGIES: Review of patient's allergies indicates no known allergies.  Current Outpatient Prescriptions  Medication Sig Dispense Refill  . calcium carbonate 200 MG capsule Take 250 mg by mouth daily.        . cholecalciferol (VITAMIN D) 1000 UNITS tablet Take 1,000 Units by mouth daily.      Marland Kitchen estradiol (VIVELLE-DOT) 0.025 MG/24HR Place 1 patch onto the skin 2 (two) times a week.  24 patch  3  . estradiol-norethindrone (COMBIPATCH) 0.05-0.25 MG/DAY Place 1 patch onto the skin 2 (two) times a week. Using this patch for 14 days a month  24 patch  3  . fish oil-omega-3 fatty acids 1000 MG capsule Take 1 g by mouth daily.        . pantoprazole (PROTONIX) 40 MG tablet Take 40 mg by mouth daily.         No current facility-administered medications for this visit.  ROS:  Pertinent items are noted in HPI.  SOCIAL HISTORY:    PHYSICAL EXAMINATION:    BP 100/58  Pulse 60  Ht 5' 6.75" (1.695 m)  Wt 136 lb (61.689 kg)  BMI 21.47 kg/m2  LMP 07/27/2008   Wt Readings from Last 3 Encounters:  05/15/13 136 lb (61.689 kg)  12/13/12 135 lb 3.2 oz (61.326 kg)  11/14/10 130 lb (58.968 kg)     Ht Readings from Last 3 Encounters:  05/15/13 5' 6.75" (1.695 m)  12/13/12 5\' 6"  (1.676 m)  11/14/10 5\' 6"  (1.676 m)    General appearance: alert, cooperative and appears stated age.   Emotional when discussing sadness over death of mother and health issues of her husband.  Breasts: Inspection negative, No nipple retraction or dimpling, No nipple discharge or bleeding, No axillary or supraclavicular adenopathy, Normal to palpation without dominant masses  ASSESSMENT  Right breast pain. HRT  patient.  Monthly bleeding while on phasic HRT.  Expected.    PLAN  Diagnostic bilateral mammogram and right breast ultrasound at [facility].  Return prn   An After Visit Summary was printed and given to the patient.

## 2013-05-22 ENCOUNTER — Other Ambulatory Visit: Payer: Self-pay | Admitting: Gastroenterology

## 2013-05-22 DIAGNOSIS — R1310 Dysphagia, unspecified: Secondary | ICD-10-CM

## 2013-05-24 ENCOUNTER — Ambulatory Visit
Admission: RE | Admit: 2013-05-24 | Discharge: 2013-05-24 | Disposition: A | Discharge status: Home / Self Care | Payer: BC Managed Care – PPO | Source: Ambulatory Visit | Attending: Obstetrics and Gynecology | Admitting: Obstetrics and Gynecology

## 2013-05-24 ENCOUNTER — Ambulatory Visit
Admission: RE | Admit: 2013-05-24 | Discharge: 2013-05-24 | Disposition: A | Discharge status: Home / Self Care | Payer: BC Managed Care – PPO | Source: Ambulatory Visit | Attending: Gastroenterology | Admitting: Gastroenterology

## 2013-05-24 DIAGNOSIS — R1310 Dysphagia, unspecified: Secondary | ICD-10-CM

## 2013-05-24 DIAGNOSIS — N644 Mastodynia: Secondary | ICD-10-CM

## 2013-06-02 ENCOUNTER — Other Ambulatory Visit (HOSPITAL_COMMUNITY)
Admission: RE | Admit: 2013-06-02 | Discharge: 2013-06-02 | Disposition: A | Discharge status: Home / Self Care | Payer: BC Managed Care – PPO | Source: Ambulatory Visit | Attending: Gastroenterology | Admitting: Gastroenterology

## 2013-06-02 ENCOUNTER — Other Ambulatory Visit: Payer: Self-pay | Admitting: Gastroenterology

## 2013-06-02 DIAGNOSIS — B379 Candidiasis, unspecified: Secondary | ICD-10-CM | POA: Insufficient documentation

## 2013-09-19 ENCOUNTER — Encounter: Payer: Self-pay | Admitting: Cardiovascular Disease

## 2014-04-03 ENCOUNTER — Encounter: Payer: Self-pay | Admitting: Nurse Practitioner

## 2014-04-03 ENCOUNTER — Ambulatory Visit (INDEPENDENT_AMBULATORY_CARE_PROVIDER_SITE_OTHER): Payer: BC Managed Care – PPO | Admitting: Nurse Practitioner

## 2014-04-03 VITALS — BP 100/66 | HR 60 | Resp 18 | Ht 67.0 in | Wt 138.0 lb

## 2014-04-03 DIAGNOSIS — Z0000 Encounter for general adult medical examination without abnormal findings: Secondary | ICD-10-CM

## 2014-04-03 DIAGNOSIS — Z79890 Hormone replacement therapy (postmenopausal): Secondary | ICD-10-CM | POA: Insufficient documentation

## 2014-04-03 DIAGNOSIS — Z23 Encounter for immunization: Secondary | ICD-10-CM

## 2014-04-03 DIAGNOSIS — Z01419 Encounter for gynecological examination (general) (routine) without abnormal findings: Secondary | ICD-10-CM

## 2014-04-03 DIAGNOSIS — E039 Hypothyroidism, unspecified: Secondary | ICD-10-CM | POA: Insufficient documentation

## 2014-04-03 LAB — POCT URINALYSIS DIPSTICK
BILIRUBIN UA: NEGATIVE
Glucose, UA: NEGATIVE
KETONES UA: NEGATIVE
LEUKOCYTES UA: NEGATIVE
Nitrite, UA: NEGATIVE
PH UA: 6
PROTEIN UA: NEGATIVE
RBC UA: NEGATIVE
Urobilinogen, UA: NEGATIVE

## 2014-04-03 LAB — HEMOGLOBIN, FINGERSTICK: Hemoglobin, fingerstick: 13.3 g/dL (ref 12.0–16.0)

## 2014-04-03 MED ORDER — ESTRADIOL 0.025 MG/24HR TD PTTW: 1.0000 | MEDICATED_PATCH | TRANSDERMAL | Status: DC

## 2014-04-03 MED ORDER — ESTRADIOL-NORETHINDRONE ACET 0.05-0.25 MG/DAY TD PTTW: 1.0000 | MEDICATED_PATCH | TRANSDERMAL | Status: DC

## 2014-04-03 NOTE — Progress Notes (Signed)
53 y.o. G1P0020 Married Caucasian Fe here for annual exam.  Doing well wit HRT other than mood changes is noted while on Combipatch.  No BTB for past 3-4 months.  Some mood changes. Still working full time.  She has no vaginal dryness but also is not SA.  No urinary symptoms.  Thyroid needle aspiration was benign 03/2013.  Her TSH and hypothyroid is followed by PCP.  Patient's last menstrual period was 07/27/2008.          Sexually active: No.  The current method of family planning is post menopausal status.    Exercising: Yes.    Walking daily Smoker:  Former  Health Maintenance: Pap: 09/2011 Neg. HR HPV: Neg MMG:  04/2013 BIRADS1: Neg Colonoscopy: 06/02/2013 - Polyps- hyperplastic BMD:   11/2012  TDaP: ? 2002 Labs:   UA: Clear Hg:13.3   reports that she has quit smoking. She has never used smokeless tobacco. She reports that she drinks about .5 ounces of alcohol per week. She reports that she does not use illicit drugs.  Past Medical History  Diagnosis Date  . Dizziness   . Chest pressure   . Palpitations   . Renal calculi   . Hypothyroidism   . Thyroid tumor, benign 2014    Past Surgical History  Procedure Laterality Date  . C-section x 1    . Biopsy thyroid  03/2013    --benign--Dr. Chalmers Cater    Current Outpatient Prescriptions  Medication Sig Dispense Refill  . calcium carbonate 200 MG capsule Take 250 mg by mouth daily.        . cholecalciferol (VITAMIN D) 1000 UNITS tablet Take 1,000 Units by mouth daily.      Marland Kitchen estradiol (VIVELLE-DOT) 0.025 MG/24HR Place 1 patch onto the skin 2 (two) times a week.  24 patch  3  . estradiol-norethindrone (COMBIPATCH) 0.05-0.25 MG/DAY Place 1 patch onto the skin 2 (two) times a week. Using this patch for 14 days a month  24 patch  3  . fish oil-omega-3 fatty acids 1000 MG capsule Take 1 g by mouth daily.        Marland Kitchen loratadine (CLARITIN) 10 MG tablet Take 10 mg by mouth daily.       No current facility-administered medications for this visit.     History reviewed. No pertinent family history.  ROS:  Pertinent items are noted in HPI.  Otherwise, a comprehensive ROS was negative.  Exam:   BP 100/66  Pulse 60  Resp 18  Ht 5\' 7"  (1.702 m)  Wt 138 lb (62.596 kg)  BMI 21.61 kg/m2  LMP 07/27/2008 Height: 5\' 7"  (170.2 cm)  Ht Readings from Last 3 Encounters:  04/03/14 5\' 7"  (1.702 m)  05/15/13 5' 6.75" (1.695 m)  12/13/12 5\' 6"  (1.676 m)    General appearance: alert, cooperative and appears stated age Head: Normocephalic, without obvious abnormality, atraumatic Neck: no adenopathy, supple, symmetrical, trachea midline and thyroid normal to inspection and palpation, no noted nodule today Lungs: clear to auscultation bilaterally Breasts: normal appearance, no masses or tenderness Heart: regular rate and rhythm Abdomen: soft, non-tender; no masses,  no organomegaly Extremities: extremities normal, atraumatic, no cyanosis or edema Skin: Skin color, texture, turgor normal. No rashes or lesions Lymph nodes: Cervical, supraclavicular, and axillary nodes normal. No abnormal inguinal nodes palpated Neurologic: Grossly normal   Pelvic: External genitalia:  no lesions              Urethra:  normal appearing urethra with no  masses, tenderness or lesions              Bartholin's and Skene's: normal                 Vagina: normal appearing vagina with normal color and discharge, no lesions              Cervix: anteverted              Pap taken: No. Bimanual Exam:  Uterus:  normal size, contour, position, consistency, mobility, non-tender              Adnexa: no mass, fullness, tenderness               Rectovaginal: Confirms               Anus:  normal sphincter tone, no lesions  A:  Well Woman with normal exam  Postmenopausal on HRT  History of hypothyroid with thyroid nodule with needle aspiration last 03/2013  Update immunizations  P:   Reviewed health and wellness pertinent to exam  Pap smear not taken today  Mammogram is  due 10/15  TDaP given today  Refill on Vivelle dot 0.025 mg twice weekly 14 days of the month  Refill on Combipatch 0.05-0.25 mg twice weekly 14 days of the month   Counseled with risk of DVT, CVA, cancer, etc.  She will be followed by PCP for labs and TSH   Counseled on breast self exam, mammography screening, use and side effects of HRT, menopause, adequate intake of calcium and vitamin D, diet and exercise, Kegel's exercises return annually or prn  An After Visit Summary was printed and given to the patient.

## 2014-04-03 NOTE — Patient Instructions (Signed)

## 2014-04-08 NOTE — Progress Notes (Signed)
Encounter reviewed by Dr. Brook Silva.  

## 2014-05-21 ENCOUNTER — Other Ambulatory Visit: Payer: Self-pay

## 2014-05-21 DIAGNOSIS — Z1231 Encounter for screening mammogram for malignant neoplasm of breast: Secondary | ICD-10-CM

## 2014-05-28 ENCOUNTER — Encounter: Payer: Self-pay | Admitting: Nurse Practitioner

## 2014-06-06 ENCOUNTER — Ambulatory Visit
Admission: RE | Admit: 2014-06-06 | Discharge: 2014-06-06 | Disposition: A | Discharge status: Home / Self Care | Payer: BC Managed Care – PPO | Source: Ambulatory Visit

## 2014-06-06 DIAGNOSIS — Z1231 Encounter for screening mammogram for malignant neoplasm of breast: Secondary | ICD-10-CM

## 2014-06-08 ENCOUNTER — Other Ambulatory Visit: Payer: Self-pay | Admitting: Nurse Practitioner

## 2014-06-08 DIAGNOSIS — R928 Other abnormal and inconclusive findings on diagnostic imaging of breast: Secondary | ICD-10-CM

## 2014-06-26 ENCOUNTER — Ambulatory Visit
Admission: RE | Admit: 2014-06-26 | Discharge: 2014-06-26 | Disposition: A | Discharge status: Home / Self Care | Payer: BC Managed Care – PPO | Source: Ambulatory Visit | Attending: Nurse Practitioner | Admitting: Nurse Practitioner

## 2014-06-26 DIAGNOSIS — R928 Other abnormal and inconclusive findings on diagnostic imaging of breast: Secondary | ICD-10-CM

## 2014-12-13 ENCOUNTER — Other Ambulatory Visit: Payer: Self-pay | Admitting: Endocrinology

## 2014-12-13 DIAGNOSIS — E049 Nontoxic goiter, unspecified: Secondary | ICD-10-CM

## 2014-12-18 ENCOUNTER — Ambulatory Visit
Admission: RE | Admit: 2014-12-18 | Discharge: 2014-12-18 | Disposition: A | Discharge status: Home / Self Care | Payer: BLUE CROSS/BLUE SHIELD | Source: Ambulatory Visit | Attending: Endocrinology | Admitting: Endocrinology

## 2014-12-18 DIAGNOSIS — E049 Nontoxic goiter, unspecified: Secondary | ICD-10-CM

## 2015-04-23 ENCOUNTER — Encounter: Payer: Self-pay | Admitting: Nurse Practitioner

## 2015-04-23 ENCOUNTER — Ambulatory Visit (INDEPENDENT_AMBULATORY_CARE_PROVIDER_SITE_OTHER): Payer: BLUE CROSS/BLUE SHIELD | Admitting: Nurse Practitioner

## 2015-04-23 VITALS — BP 116/58 | HR 60 | Resp 16 | Ht 66.25 in | Wt 138.0 lb

## 2015-04-23 DIAGNOSIS — Z01419 Encounter for gynecological examination (general) (routine) without abnormal findings: Secondary | ICD-10-CM | POA: Diagnosis not present

## 2015-04-23 DIAGNOSIS — Z0000 Encounter for general adult medical examination without abnormal findings: Secondary | ICD-10-CM | POA: Diagnosis not present

## 2015-04-23 LAB — HEMOGLOBIN, FINGERSTICK: Hemoglobin, fingerstick: 13.3 g/dL (ref 12.0–16.0)

## 2015-04-23 LAB — POCT URINALYSIS DIPSTICK
BILIRUBIN UA: NEGATIVE
Glucose, UA: NEGATIVE
KETONES UA: NEGATIVE
Nitrite, UA: NEGATIVE
PH UA: 5
PROTEIN UA: NEGATIVE
RBC UA: NEGATIVE
Urobilinogen, UA: NEGATIVE

## 2015-04-23 LAB — COMPREHENSIVE METABOLIC PANEL
ALK PHOS: 39 U/L (ref 33–130)
ALT: 13 U/L (ref 6–29)
AST: 17 U/L (ref 10–35)
Albumin: 4 g/dL (ref 3.6–5.1)
BILIRUBIN TOTAL: 0.4 mg/dL (ref 0.2–1.2)
BUN: 15 mg/dL (ref 7–25)
CALCIUM: 9.1 mg/dL (ref 8.6–10.4)
CO2: 23 mmol/L (ref 20–31)
Chloride: 106 mmol/L (ref 98–110)
Creat: 0.84 mg/dL (ref 0.50–1.05)
GLUCOSE: 54 mg/dL — AB (ref 65–99)
Potassium: 4.4 mmol/L (ref 3.5–5.3)
SODIUM: 140 mmol/L (ref 135–146)
Total Protein: 6 g/dL — ABNORMAL LOW (ref 6.1–8.1)

## 2015-04-23 LAB — LIPID PANEL
Cholesterol: 166 mg/dL (ref 125–200)
HDL: 60 mg/dL (ref >=46)
LDL CALC: 91 mg/dL (ref <130)
Total CHOL/HDL Ratio: 2.8 Ratio (ref <=5.0)
Triglycerides: 75 mg/dL (ref <150)
VLDL: 15 mg/dL (ref <30)

## 2015-04-23 MED ORDER — ESTRADIOL-NORETHINDRONE ACET 0.05-0.25 MG/DAY TD PTTW: 1.0000 | MEDICATED_PATCH | TRANSDERMAL | Status: DC

## 2015-04-23 MED ORDER — ESTRADIOL 0.025 MG/24HR TD PTTW: 1.0000 | MEDICATED_PATCH | TRANSDERMAL | Status: DC

## 2015-04-23 NOTE — Patient Instructions (Signed)

## 2015-04-23 NOTE — Progress Notes (Signed)
54 y.o. G75P002 Married  Caucasian Fe here for annual exam.  Rare vaso symptoms.  Sleep is good.  Still working full time.  Dr. Chalmers Cater following thyroid nodules. She remains on Vivelle dot for two weeks and Combipatch for 2 weeks.  Rare spotting now.  Her husband who is disabled still dealing with depression.  Son is now 71 and daughter is 22.  Patient's last menstrual period was 07/27/2008.          Sexually active: No.  The current method of family planning is postmenopausal status. Exercising: Yes.    walking and weights 2 x weekly Smoker:  Former smoker  Health Maintenance: Pap:  10/15/11 WNL/negative HR HPV MMG:  06/06/14 3D with left diag/US on 06/26/14 BiRads 2-benign-screening in one year Colonoscopy:  06/02/13-repeat in 10 years BMD:   12/13/12 -2.0 s/-0.8 l TDaP:  04/03/14 Labs: HGB-13.3  URINE-WBC-1+   reports that she has quit smoking. She has never used smokeless tobacco. She reports that she drinks about 0.6 - 1.2 oz of alcohol per week. She reports that she does not use illicit drugs.  Past Medical History  Diagnosis Date  . Dizziness   . Chest pressure   . Palpitations   . Renal calculi   . Hypothyroidism   . Thyroid tumor, benign 2014    Past Surgical History  Procedure Laterality Date  . C-section x 1    . Biopsy thyroid  03/2013    --benign--Dr. Chalmers Cater    Current Outpatient Prescriptions  Medication Sig Dispense Refill  . calcium carbonate 200 MG capsule Take 250 mg by mouth daily.      . cholecalciferol (VITAMIN D) 1000 UNITS tablet Take 1,000 Units by mouth daily.    Marland Kitchen estradiol (VIVELLE-DOT) 0.025 MG/24HR Place 1 patch onto the skin 2 (two) times a week. 24 patch 3  . estradiol-norethindrone (COMBIPATCH) 0.05-0.25 MG/DAY Place 1 patch onto the skin 2 (two) times a week. Using this patch for 14 days a month 24 patch 3  . fish oil-omega-3 fatty acids 1000 MG capsule Take 1 g by mouth daily.      Marland Kitchen loratadine (CLARITIN) 10 MG tablet Take 10 mg by mouth daily.      No current facility-administered medications for this visit.    Family History  Problem Relation Age of Onset  . Osteoporosis Mother   . COPD Mother   . Bipolar disorder Sister   . Bipolar disorder Sister     ROS:  Pertinent items are noted in HPI.  Otherwise, a comprehensive ROS was negative.  Exam:   BP 116/58 mmHg  Pulse 60  Resp 16  Ht 5' 6.25" (1.683 m)  Wt 138 lb (62.596 kg)  BMI 22.10 kg/m2  LMP 07/27/2008 Height: 5' 6.25" (168.3 cm) Ht Readings from Last 3 Encounters:  04/23/15 5' 6.25" (1.683 m)  04/03/14 5\' 7"  (1.702 m)  05/15/13 5' 6.75" (1.695 m)    General appearance: alert, cooperative and appears stated age Head: Normocephalic, without obvious abnormality, atraumatic Neck: no adenopathy, supple, symmetrical, trachea midline and thyroid normal to inspection and palpation with only barely palpable nodule on right Lungs: clear to auscultation bilaterally Breasts: normal appearance, no masses or tenderness Heart: regular rate and rhythm Abdomen: soft, non-tender; no masses,  no organomegaly Extremities: extremities normal, atraumatic, no cyanosis or edema Skin: Skin color, texture, turgor normal. No rashes or lesions Lymph nodes: Cervical, supraclavicular, and axillary nodes normal. No abnormal inguinal nodes palpated Neurologic: Grossly normal  Pelvic: External genitalia:  no lesions              Urethra:  normal appearing urethra with no masses, tenderness or lesions              Bartholin's and Skene's: normal                 Vagina: normal appearing vagina with normal color and discharge, no lesions              Cervix: anteverted              Pap taken: Yes.   Bimanual Exam:  Uterus:  normal size, contour, position, consistency, mobility, non-tender              Adnexa: no mass, fullness, tenderness               Rectovaginal: Confirms               Anus:  normal sphincter tone, no lesions  Chaperone present: yes  A:  Well Woman with normal  exam  Postmenopausal on HRT History of hypothyroid with thyroid nodule with needle aspiration last done 03/2013   P:   Reviewed health and wellness pertinent to exam  Pap smear as above  Mammogram is due 05/2015  Refill on HRT for a year  Counseled with risk of CVA, DVT, cancer, etc.   Counseled on breast self exam, mammography screening, use and side effects of HRT, adequate intake of calcium and vitamin D, diet and exercise return annually or prn  An After Visit Summary was printed and given to the patient.  She is such a beautiful lady with a big heart for people.

## 2015-04-24 LAB — VITAMIN D 25 HYDROXY (VIT D DEFICIENCY, FRACTURES): VIT D 25 HYDROXY: 41 ng/mL (ref 30–100)

## 2015-04-25 LAB — IPS PAP TEST WITH HPV

## 2015-04-25 NOTE — Progress Notes (Signed)
Encounter reviewed by Dr. Brook Amundson C. Silva.  

## 2015-05-16 ENCOUNTER — Other Ambulatory Visit: Payer: Self-pay

## 2015-05-16 DIAGNOSIS — Z1231 Encounter for screening mammogram for malignant neoplasm of breast: Secondary | ICD-10-CM

## 2015-06-12 ENCOUNTER — Ambulatory Visit
Admission: RE | Admit: 2015-06-12 | Discharge: 2015-06-12 | Disposition: A | Discharge status: Home / Self Care | Payer: BLUE CROSS/BLUE SHIELD | Source: Ambulatory Visit

## 2015-06-12 DIAGNOSIS — Z1231 Encounter for screening mammogram for malignant neoplasm of breast: Secondary | ICD-10-CM

## 2015-06-17 ENCOUNTER — Other Ambulatory Visit: Payer: Self-pay | Admitting: Nurse Practitioner

## 2015-06-17 DIAGNOSIS — R928 Other abnormal and inconclusive findings on diagnostic imaging of breast: Secondary | ICD-10-CM

## 2015-06-24 ENCOUNTER — Ambulatory Visit
Admission: RE | Admit: 2015-06-24 | Discharge: 2015-06-24 | Disposition: A | Discharge status: Home / Self Care | Payer: BLUE CROSS/BLUE SHIELD | Source: Ambulatory Visit | Attending: Nurse Practitioner | Admitting: Nurse Practitioner

## 2015-06-24 DIAGNOSIS — R928 Other abnormal and inconclusive findings on diagnostic imaging of breast: Secondary | ICD-10-CM

## 2016-03-24 ENCOUNTER — Encounter: Payer: Self-pay | Admitting: Obstetrics and Gynecology

## 2016-03-24 ENCOUNTER — Telehealth: Payer: Self-pay | Admitting: Nurse Practitioner

## 2016-03-24 ENCOUNTER — Ambulatory Visit (INDEPENDENT_AMBULATORY_CARE_PROVIDER_SITE_OTHER): Payer: BLUE CROSS/BLUE SHIELD | Admitting: Obstetrics and Gynecology

## 2016-03-24 VITALS — BP 122/78 | HR 56 | Resp 12 | Wt 142.0 lb

## 2016-03-24 DIAGNOSIS — N950 Postmenopausal bleeding: Secondary | ICD-10-CM | POA: Diagnosis not present

## 2016-03-24 NOTE — Telephone Encounter (Signed)
Spoke with patient. Patient states that she has not had a cycle in ten years. Reports she has recently started having spotting. Denies any heavy bleeding. Reports she is having cramping like her menses. Current uses Estradiol .025 mg patch the first 2 weeks of the month and Combipatch the last 2 weeks of the month. Reports sometimes she does replace her Combipatch a day or so late due to travel with work and feel she may have been late replacing her patch recently. Patient works for [employer]. States there has been a recent problem with the product their uniforms were treated with. States "The uniforms have endocrine disrupters in them and are causing a lot of people to have problems." Patient has been evaluated and is being followed by Dr.Balan. "I have read that some people are having problems with menstrual bleeding as well. I am not sure if this could have anything to do with it." Advised she will need to be seen in the office for further evaluation. She is agreeable. Appointment scheduled for today at 1:15 pm with Dr.Jertson. She is agreeable to date and time.  Routing to provider for final review. Patient agreeable to disposition. Will close encounter.

## 2016-03-24 NOTE — Telephone Encounter (Signed)
Patient called and said, "I have not had a menstrual cycle in about 10 years but now I am having some bleeding."  Last seen: 04/23/15

## 2016-03-24 NOTE — Progress Notes (Signed)
GYNECOLOGY  VISIT   HPI: 55 y.o.   Married  Caucasian  female   G4P0020 with Patient's last menstrual period was 07/27/2008.  The patient is on a low dose estradiol patch for 2 weeks, then the combipatch for 2 weeks the month.  here c/o postmenopause spotting and cramping X 2 days. The spotting has been dark red, some dark red discharge. The cramping is mild.  She is constipated, long term issue, dehydrated. 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  Not sexually active, husband is disabled.   GYNECOLOGIC HISTORY: Patient's last menstrual period was 07/27/2008. Contraception:postmenopause Menopausal hormone therapy: none         OB History    Gravida Para Term Preterm AB Living   4 2     2      SAB TAB Ectopic Multiple Live Births   2                 Patient Active Problem List   Diagnosis Date Noted  . Postmenopausal hormone replacement therapy 04/03/2014  . Unspecified hypothyroidism 04/03/2014  . Chest pain 11/14/2010    Past Medical History:  Diagnosis Date  . Chest pressure   . Dizziness   . Hypothyroidism   . Palpitations   . Renal calculi   . Thyroid tumor, benign 2014    Past Surgical History:  Procedure Laterality Date  . BIOPSY THYROID  03/2013   --benign--Dr. Chalmers Cater  . C-section x 1      Current Outpatient Prescriptions  Medication Sig Dispense Refill  . calcium carbonate 200 MG capsule Take 250 mg by mouth daily.      . cholecalciferol (VITAMIN D) 1000 UNITS tablet Take 1,000 Units by mouth daily.    Marland Kitchen estradiol (VIVELLE-DOT) 0.025 MG/24HR Place 1 patch onto the skin 2 (two) times a week. 24 patch 3  . estradiol-norethindrone (COMBIPATCH) 0.05-0.25 MG/DAY Place 1 patch onto the skin 2 (two) times a week. Using this patch for 14 days a month 24 patch 3  . fish oil-omega-3 fatty acids 1000 MG capsule Take 1 g by mouth daily.      Marland Kitchen loratadine (CLARITIN) 10 MG tablet Take 10 mg by mouth daily.     No current facility-administered medications  for this visit.      ALLERGIES: Review of patient's allergies indicates no known allergies.  Family History  Problem Relation Age of Onset  . Osteoporosis Mother   . COPD Mother   . Bipolar disorder Sister   . Bipolar disorder Sister     Social History   Social History  . Marital status: Married    Spouse name: N/A  . Number of children: N/A  . Years of education: N/A   Occupational History  . Not on file.   Social History Main Topics  . Smoking status: Former Smoker    Packs/day: 0.25    Years: 20.00  . Smokeless tobacco: Never Used     Comment: smoke occasionally  . Alcohol use 0.6 - 1.2 oz/week    1 - 2 Standard drinks or equivalent per week  . Drug use: No  . Sexual activity: No     Comment: husband paralyzed   Other Topics Concern  . Not on file   Social History Narrative   Husband has spinal cord injury about January 2012. He is in a wheelchair and disabled.  No bowel / bladder control.    Review of Systems  Constitutional: Negative.  HENT: Negative.   Eyes: Negative.   Respiratory: Negative.   Cardiovascular: Negative.   Gastrointestinal: Negative.   Genitourinary:       Post menopause bleeding  Pelvic cramping  Musculoskeletal: Negative.   Skin: Negative.   Neurological: Negative.   Endo/Heme/Allergies: Negative.   Psychiatric/Behavioral: Negative.     PHYSICAL EXAMINATION:    BP 122/78 (BP Location: Right Arm, Patient Position: Sitting, Cuff Size: Normal)   Pulse (!) 56   Resp 12   Wt 142 lb (64.4 kg)   LMP 07/27/2008   BMI 22.75 kg/m     General appearance: alert, cooperative and appears stated age Neck: no adenopathy, supple, symmetrical, trachea midline and thyroid small nodule felt on the right side of the thyroid (per patient no change) Abdomen: soft, non-tender; bowel sounds normal; no masses,  no organomegaly  Pelvic: External genitalia:  no lesions              Urethra:  normal appearing urethra with no masses, tenderness or  lesions              Bartholins and Skenes: normal                 Vagina: normal appearing vagina with normal color and discharge, no lesions              Cervix: no lesions              Bimanual Exam:  Uterus:  normal size, contour, position, consistency, mobility, non-tender              Adnexa: no mass, fullness, tenderness               The risks of endometrial biopsy were reviewed and a consent was obtained.  A speculum was placed in the vagina and the cervix was cleansed with betadine. A tenaculum was placed on the cervix and the mini-pipelle was placed into the endometrial cavity. The uterus sounded to 7 cm. The endometrial biopsy was performed, minimal tissue was obtained. The tenaculum and speculum were removed. There were no complications.   Chaperone was present for exam.  ASSESSMENT Postmenopausal bleeding on HRT    PLAN Endometrial biopsy Normal pap last September F/U for an ultrasound, possible sonohysterogram   An After Visit Summary was printed and given to the patient.  CC: [name]

## 2016-03-24 NOTE — Patient Instructions (Signed)

## 2016-03-27 ENCOUNTER — Telehealth: Payer: Self-pay | Admitting: *Deleted

## 2016-03-27 NOTE — Telephone Encounter (Signed)
Left message to call regarding BX results-eh

## 2016-03-27 NOTE — Telephone Encounter (Signed)
-----   Message from Salvadore Dom, MD sent at 03/26/2016  5:06 PM EDT ----- Please inform the patient that her endometrial biopsy returned with atrophic tissue which is good. We still need to do the ultrasound to r/o a polyp.

## 2016-03-31 ENCOUNTER — Encounter: Payer: Self-pay | Admitting: Obstetrics and Gynecology

## 2016-03-31 ENCOUNTER — Other Ambulatory Visit: Payer: Self-pay | Admitting: Obstetrics and Gynecology

## 2016-03-31 ENCOUNTER — Ambulatory Visit (INDEPENDENT_AMBULATORY_CARE_PROVIDER_SITE_OTHER): Payer: BLUE CROSS/BLUE SHIELD

## 2016-03-31 ENCOUNTER — Ambulatory Visit (INDEPENDENT_AMBULATORY_CARE_PROVIDER_SITE_OTHER): Payer: BLUE CROSS/BLUE SHIELD | Admitting: Obstetrics and Gynecology

## 2016-03-31 VITALS — BP 112/70 | HR 56 | Resp 13 | Wt 141.0 lb

## 2016-03-31 DIAGNOSIS — N950 Postmenopausal bleeding: Secondary | ICD-10-CM | POA: Diagnosis not present

## 2016-03-31 DIAGNOSIS — Z79890 Hormone replacement therapy (postmenopausal): Secondary | ICD-10-CM | POA: Diagnosis not present

## 2016-03-31 NOTE — Progress Notes (Signed)
GYNECOLOGY  VISIT   HPI: 55 y.o.   Married  Caucasian  female   [phone] with Patient's last menstrual period was 07/27/2008.   here for follow up postmenopause bleeding on HRT. Endometrial biopsy c/w atrophy.       GYNECOLOGIC HISTORY: Patient's last menstrual period was 07/27/2008. Contraception:postmenopause Menopausal hormone therapy: none         OB History    Gravida Para Term Preterm AB Living   4 2     2      SAB TAB Ectopic Multiple Live Births   2                 Patient Active Problem List   Diagnosis Date Noted  . Postmenopausal hormone replacement therapy 04/03/2014  . Unspecified hypothyroidism 04/03/2014  . Chest pain 11/14/2010    Past Medical History:  Diagnosis Date  . Chest pressure   . Dizziness   . Hypothyroidism   . Palpitations   . Renal calculi   . Thyroid tumor, benign 2014    Past Surgical History:  Procedure Laterality Date  . BIOPSY THYROID  03/2013   --benign--Dr. Chalmers Cater  . C-section x 1      Current Outpatient Prescriptions  Medication Sig Dispense Refill  . calcium carbonate 200 MG capsule Take 250 mg by mouth daily.      . cholecalciferol (VITAMIN D) 1000 UNITS tablet Take 1,000 Units by mouth daily.    Marland Kitchen estradiol (VIVELLE-DOT) 0.025 MG/24HR Place 1 patch onto the skin 2 (two) times a week. 24 patch 3  . estradiol-norethindrone (COMBIPATCH) 0.05-0.25 MG/DAY Place 1 patch onto the skin 2 (two) times a week. Using this patch for 14 days a month 24 patch 3  . fish oil-omega-3 fatty acids 1000 MG capsule Take 1 g by mouth daily.      Marland Kitchen loratadine (CLARITIN) 10 MG tablet Take 10 mg by mouth daily.     No current facility-administered medications for this visit.      ALLERGIES: Review of patient's allergies indicates no known allergies.  Family History  Problem Relation Age of Onset  . Osteoporosis Mother   . COPD Mother   . Bipolar disorder Sister   . Bipolar disorder Sister     Social History   Social History  . Marital  status: Married    Spouse name: N/A  . Number of children: N/A  . Years of education: N/A   Occupational History  . Not on file.   Social History Main Topics  . Smoking status: Former Smoker    Packs/day: 0.25    Years: 20.00  . Smokeless tobacco: Never Used     Comment: smoke occasionally  . Alcohol use 0.6 - 1.2 oz/week    1 - 2 Standard drinks or equivalent per week  . Drug use: No  . Sexual activity: No     Comment: husband paralyzed   Other Topics Concern  . Not on file   Social History Narrative   Husband has spinal cord injury about January 2012. He is in a wheelchair and disabled.  No bowel / bladder control.    Review of Systems  Constitutional: Negative.   HENT: Negative.   Eyes: Negative.   Respiratory: Negative.   Cardiovascular: Negative.   Gastrointestinal: Negative.   Genitourinary: Negative.   Musculoskeletal: Negative.   Skin: Negative.   Neurological: Negative.   Endo/Heme/Allergies: Negative.   Psychiatric/Behavioral: Negative.     PHYSICAL EXAMINATION:    BP  112/70 (BP Location: Right Arm, Patient Position: Sitting, Cuff Size: Normal)   Pulse (!) 56   Resp 13   Wt 141 lb (64 kg)   LMP 07/27/2008   BMI 22.59 kg/m     General appearance: alert, cooperative and appears stated age  Pelvic: External genitalia:  no lesions              Urethra:  normal appearing urethra with no masses, tenderness or lesions              Bartholins and Skenes: normal                 Vagina: normal appearing vagina with normal color and discharge, no lesions              Cervix: without lesions  Sonohysterogram The procedure and risks of the procedure were reviewed with the patient, consent form was signed. A speculum was placed in the vagina and the cervix was cleansed with hibacleans. The sonohysterogram catheter was inserted into the uterine cavity without difficulty. Saline was infused under direct observation with the ultrasound. No intracavitary defects  were noted.The catheter was removed.    Chaperone was present for exam.  ASSESSMENT Postmenopausal bleeding on HRT, negative w/u     PLAN Recommended she only take the combipatch, not 2 weeks on the combipatch and 2 weeks on estradiol patch She states she feels better when she is just on the estradiol patch Discussed the need for endometrial protection Reviewed other options of the estradiol patch and oral progesterone (she didn't like it in the past), or a mirena IUD and topical estradiol.  She will use the combipatch for now and let us know if she wants to try something else F/U with Ms Raquel Sarna for an annual exam later this month Call with recurrent bleeding or any other concerns   An After Visit Summary was printed and given to the patient.  25 minutes face to face time of which over 50% was spent in counseling.

## 2016-03-31 NOTE — Telephone Encounter (Signed)
Patient is returning a call to Elaine. °

## 2016-03-31 NOTE — Telephone Encounter (Signed)
Spoke with patient and informed her of her BX results. She is coming in this afternoon for her U/S-eh

## 2016-04-17 ENCOUNTER — Telehealth: Payer: Self-pay | Admitting: Obstetrics and Gynecology

## 2016-04-17 NOTE — Telephone Encounter (Signed)
She just needs to hang in there, it should improve with time. Anytime you change the HRT regimen you can have spotting. She has had a negative evaluation, so I'm not worried there is something bad going on.

## 2016-04-17 NOTE — Telephone Encounter (Signed)
Patient called states she saw Dr Talbert Nan a couple of weeks ago for some postmenopausal bleeding.  Patient states she is bleeding/spotting as of this morning and was told to call us if this happened again.  Pt has appointment scheduled for 04/24/16 for her annual with [name].    Pt last seen 03/31/2016

## 2016-04-17 NOTE — Telephone Encounter (Signed)
Spoke with patient. Advised of message as seen below from Dr.Jertson. Patient is agreeable and verbalizes understanding.  Routing to provider for final review. Patient agreeable to disposition. Will close encounter.  

## 2016-04-17 NOTE — Telephone Encounter (Signed)
Spoke with patient. Patient was last seen in the office on 03/31/2016 for PMB with HRT. SHGM was perfromed on 03/31/2016 which was normal. EMB was performed on 03/24/2016 which was c/w atrophy. At 03/31/2016 appointment it was recommended that the patient only use the Combipatch as she was switching between 2 weeks on Combipatch and 2 weeks on Estradiol patch. States she has only been using the Combipatch since her visit. Changed her patch on 04/15/2016 12 hours late due to traveling for work. Patient woke up this morning and is having light spotting and mild cramping. Denies any heavy bleeding or pain. "I feel like my body is trying to have a cycle." Advised patient I will speak with Dr.Jertson regarding recommendations and return call. Patient is agreeable.

## 2016-04-24 ENCOUNTER — Ambulatory Visit (INDEPENDENT_AMBULATORY_CARE_PROVIDER_SITE_OTHER): Payer: BLUE CROSS/BLUE SHIELD | Admitting: Nurse Practitioner

## 2016-04-24 ENCOUNTER — Encounter: Payer: Self-pay | Admitting: Nurse Practitioner

## 2016-04-24 VITALS — BP 108/62 | HR 64 | Resp 12 | Ht 65.75 in | Wt 143.6 lb

## 2016-04-24 DIAGNOSIS — Z01419 Encounter for gynecological examination (general) (routine) without abnormal findings: Secondary | ICD-10-CM

## 2016-04-24 DIAGNOSIS — N950 Postmenopausal bleeding: Secondary | ICD-10-CM | POA: Diagnosis not present

## 2016-04-24 DIAGNOSIS — Z0000 Encounter for general adult medical examination without abnormal findings: Secondary | ICD-10-CM | POA: Diagnosis not present

## 2016-04-24 DIAGNOSIS — R8290 Unspecified abnormal findings in urine: Secondary | ICD-10-CM | POA: Diagnosis not present

## 2016-04-24 DIAGNOSIS — Z79890 Hormone replacement therapy (postmenopausal): Secondary | ICD-10-CM | POA: Diagnosis not present

## 2016-04-24 LAB — CBC WITH DIFFERENTIAL/PLATELET
Basophils Absolute: 0 cells/uL (ref 0–200)
Basophils Relative: 0 %
EOS ABS: 49 {cells}/uL (ref 15–500)
Eosinophils Relative: 1 %
HEMATOCRIT: 42.4 % (ref 35.0–45.0)
Hemoglobin: 14 g/dL (ref 11.7–15.5)
LYMPHS PCT: 28 %
Lymphs Abs: 1372 cells/uL (ref 850–3900)
MCH: 31 pg (ref 27.0–33.0)
MCHC: 33 g/dL (ref 32.0–36.0)
MCV: 94 fL (ref 80.0–100.0)
MONO ABS: 490 {cells}/uL (ref 200–950)
MONOS PCT: 10 %
MPV: 10.5 fL (ref 7.5–12.5)
NEUTROS PCT: 61 %
Neutro Abs: 2989 cells/uL (ref 1500–7800)
Platelets: 213 10*3/uL (ref 140–400)
RBC: 4.51 MIL/uL (ref 3.80–5.10)
RDW: 13.6 % (ref 11.0–15.0)
WBC: 4.9 10*3/uL (ref 3.8–10.8)

## 2016-04-24 LAB — COMPREHENSIVE METABOLIC PANEL
ALT: 12 U/L (ref 6–29)
AST: 17 U/L (ref 10–35)
Albumin: 4.3 g/dL (ref 3.6–5.1)
Alkaline Phosphatase: 40 U/L (ref 33–130)
BILIRUBIN TOTAL: 0.5 mg/dL (ref 0.2–1.2)
BUN: 11 mg/dL (ref 7–25)
CHLORIDE: 105 mmol/L (ref 98–110)
CO2: 28 mmol/L (ref 20–31)
CREATININE: 0.85 mg/dL (ref 0.50–1.05)
Calcium: 9.6 mg/dL (ref 8.6–10.4)
Glucose, Bld: 62 mg/dL — ABNORMAL LOW (ref 65–99)
Potassium: 4.4 mmol/L (ref 3.5–5.3)
SODIUM: 141 mmol/L (ref 135–146)
TOTAL PROTEIN: 6.5 g/dL (ref 6.1–8.1)

## 2016-04-24 LAB — LIPID PANEL
CHOL/HDL RATIO: 2.7 ratio (ref <=5.0)
CHOLESTEROL: 170 mg/dL (ref 125–200)
HDL: 63 mg/dL (ref >=46)
LDL Cholesterol: 91 mg/dL (ref <130)
Triglycerides: 80 mg/dL (ref <150)
VLDL: 16 mg/dL (ref <30)

## 2016-04-24 LAB — POCT URINALYSIS DIPSTICK
Bilirubin, UA: NEGATIVE
Glucose, UA: NEGATIVE
KETONES UA: NEGATIVE
Leukocytes, UA: NEGATIVE
Nitrite, UA: NEGATIVE
PROTEIN UA: NEGATIVE
RBC UA: NEGATIVE
UROBILINOGEN UA: NEGATIVE
pH, UA: 7

## 2016-04-24 LAB — TSH: TSH: 0.66 m[IU]/L

## 2016-04-24 MED ORDER — ESTRADIOL-NORETHINDRONE ACET 0.05-0.25 MG/DAY TD PTTW: 1.0000 | MEDICATED_PATCH | TRANSDERMAL | 3 refills | Status: DC

## 2016-04-24 NOTE — Progress Notes (Signed)
55 y.o. G42P0020 Married  Caucasian Fe here for annual exam.  recent PMB with negative PUS and endo biopsy on 03/24/16.   On 04/17/16 had another episode of bleeding that was brown in color and spotted for 2 days.  Some right lower quadrant pain and suprapubic pressure that she is unsure if bladder or OV pain.  She also has breast tenderness.  All of theses are like a normal onset of a period.   She is very concerned over the uniforms that [employer] is having them to wear and the number of people who have now been sick due to the toxins that are found in the fibers.  One of the main SE is irregular vaginal bleeding.  She requested another pap for this year.  Children and husband are doing well.  Patient's last menstrual period was 07/27/2008.          Sexually active: No.  The current method of family planning is husband is paralyzed.    Exercising: Yes.    Walking, Weights Smoker:  no  Health Maintenance: Pap:  04/23/15 WNL neg HR HPV MMG:  06/14/15 BIRADS0, Density B, Breast Center; Dx R Breast, BIRADS1, Density C Breast Center Colonoscopy: Never BMD: 12/13/12 -2.0 s/-0.8 l Osteopenia TDaP:  04/03/14  Labs: Blood drawn  Urine: Normal   reports that she has quit smoking. She has a 5.00 pack-year smoking history. She has never used smokeless tobacco. She reports that she drinks about 0.6 - 1.2 oz of alcohol per week . She reports that she does not use drugs.  Past Medical History:  Diagnosis Date  . Chest pressure   . Dizziness   . Hypothyroidism   . Palpitations   . Renal calculi   . Thyroid tumor, benign 2014    Past Surgical History:  Procedure Laterality Date  . BIOPSY THYROID  03/2013   --benign--Dr. Chalmers Cater  . C-section x 1      Current Outpatient Prescriptions  Medication Sig Dispense Refill  . calcium carbonate 200 MG capsule Take 250 mg by mouth daily.      . cholecalciferol (VITAMIN D) 1000 UNITS tablet Take 1,000 Units by mouth daily.    Marland Kitchen estradiol-norethindrone  (COMBIPATCH) 0.05-0.25 MG/DAY Place 1 patch onto the skin 2 (two) times a week. Using this patch for 14 days a month 24 patch 3  . fish oil-omega-3 fatty acids 1000 MG capsule Take 1 g by mouth daily.      Marland Kitchen loratadine (CLARITIN) 10 MG tablet Take 10 mg by mouth daily.     No current facility-administered medications for this visit.     Family History  Problem Relation Age of Onset  . Osteoporosis Mother   . COPD Mother   . Bipolar disorder Sister   . Bipolar disorder Sister     ROS:  Pertinent items are noted in HPI.  Otherwise, a comprehensive ROS was negative.  Exam:   BP 108/62 (BP Location: Left Arm, Patient Position: Sitting, Cuff Size: Normal)   Pulse 64   Resp 12   Ht 5' 5.75" (1.67 m)   Wt 143 lb 9.6 oz (65.1 kg)   LMP 07/27/2008   BMI 23.35 kg/m  Height: 5' 5.75" (167 cm) Ht Readings from Last 3 Encounters:  04/24/16 5' 5.75" (1.67 m)  04/23/15 5' 6.25" (1.683 m)  04/03/14 5\' 7"  (1.702 m)    General appearance: alert, cooperative and appears stated age Head: Normocephalic, without obvious abnormality, atraumatic Neck: no adenopathy, supple,  symmetrical, trachea midline and thyroid normal to inspection and palpation Lungs: clear to auscultation bilaterally Breasts: normal appearance, no masses or tenderness Heart: regular rate and rhythm Abdomen: soft, non-tender; no masses,  no organomegaly Extremities: extremities normal, atraumatic, no cyanosis or edema Skin: Skin color, texture, turgor normal. No rashes or lesions Lymph nodes: Cervical, supraclavicular, and axillary nodes normal. No abnormal inguinal nodes palpated Neurologic: Grossly normal   Pelvic: External genitalia:  no lesions              Urethra:  normal appearing urethra with no masses, tenderness or lesions              Bartholin's and Skene's: normal                 Vagina: normal appearing vagina with normal color and discharge, no lesions              Cervix: anteverted              Pap  taken: Yes.   Bimanual Exam:  Uterus:  normal size, contour, position, consistency, mobility, non-tender              Adnexa: no mass, fullness, tenderness               Rectovaginal: Confirms               Anus:  normal sphincter tone, no lesions  Chaperone present: yes  A:  Well Woman with normal exam  Postmenopausal on HRT History of hypothyroid with thyroid nodule with needle aspiration last done 03/2013  PMB 03/24/2016 with normal PUS and endo biopsy, recent spotting see PC note   P:   Reviewed health and wellness pertinent to exam  Pap smear as above  Mammogram is due 11/17  Refill on Combipatch for a year  Counseled with risk of CVA, DVT, cancer, etc.  Will follow with pap and labs  Counseled on breast self exam, mammography screening, use and side effects of HRT, adequate intake of calcium and vitamin D, diet and exercise, Kegel's exercises return annually or prn  An After Visit Summary was printed and given to the patient.

## 2016-04-24 NOTE — Progress Notes (Signed)
Encounter reviewed by Dr. Aundria Rud.  Combipatch is designed to be used twice weekly every week.  I reviewed her chart that she was using unopposed estrogen for 2 weeks every month and stopped after she had postmenopausal bleeding evaluation with Dr. Talbert Nan.  May benefit from reviewing her HRT regimen again.

## 2016-04-24 NOTE — Patient Instructions (Signed)

## 2016-04-25 LAB — HEMOGLOBIN A1C
Hgb A1c MFr Bld: 5.4 % (ref <5.7)
Mean Plasma Glucose: 108 mg/dL

## 2016-04-25 LAB — VITAMIN D 25 HYDROXY (VIT D DEFICIENCY, FRACTURES): VIT D 25 HYDROXY: 43 ng/mL (ref 30–100)

## 2016-04-25 LAB — URINE CULTURE: ORGANISM ID, BACTERIA: NO GROWTH

## 2016-04-28 ENCOUNTER — Telehealth: Payer: Self-pay

## 2016-04-28 LAB — IPS PAP TEST WITH HPV

## 2016-04-28 NOTE — Telephone Encounter (Signed)
She has been on HRT for several yrs.  The dosing was recently changed due to PMB.  There is no reason to do [facility] as we already know she is menopausal.  She is unable to have [place] done while on ERT and would have to come off med's for the test to be accurate.  I did discuss with Dr. Talbert Nan and she agrees.

## 2016-04-28 NOTE — Telephone Encounter (Signed)
Spoke with patient. Advised of message as seen below from Kem Boroughs, [place]. Patient is agreeable and verbalizes understanding. Asking if her Yvonne Strickland level was checked at this appointment. Advised if was not checked with her most recent lab work. Patient would like to have this added if possible. Advised I will review with Kem Boroughs, FNP and return call with further recommendations. Patient is agreeable. Patient is currently using the Combipatch for HRT.

## 2016-04-28 NOTE — Telephone Encounter (Signed)
-----   Message from Kem Boroughs, [place] sent at 04/28/2016  8:07 AM EDT ----- Please let pt know that HGB AIC, Lipid panel, TSH, Vit D, CBC, CMP and urine culture were all negative.  The previous information she had about diabetes was incorrect with her HGB AIC being very normal and BS was even low on the CMP.

## 2016-04-28 NOTE — Telephone Encounter (Signed)
Left message for patient to return call to me. -sco

## 2016-04-29 ENCOUNTER — Telehealth: Payer: Self-pay | Admitting: Nurse Practitioner

## 2016-04-29 NOTE — Telephone Encounter (Signed)
Left message to call Special Ranes at [phone]. 

## 2016-04-29 NOTE — Telephone Encounter (Signed)
Please see telephone encounter dated 04/28/2016.  Will close encounter.

## 2016-04-29 NOTE — Telephone Encounter (Signed)
Patient returning a call regarding some lab work

## 2016-04-29 NOTE — Telephone Encounter (Signed)
Spoke with patient. Advised of message as seen below from Kem Boroughs, [place]. Patient is agreeable and verbalizes understanding. Patient is requesting her lab work be sent to her home address on file. Verbal request for release of medical records completed and to the front desk for labs from 04/24/2016 to be mailed.  Routing to provider for final review. Patient agreeable to disposition. Will close encounter.

## 2016-05-20 ENCOUNTER — Other Ambulatory Visit: Payer: Self-pay | Admitting: Nurse Practitioner

## 2016-05-20 MED ORDER — ESTRADIOL-NORETHINDRONE ACET 0.05-0.25 MG/DAY TD PTTW: MEDICATED_PATCH | TRANSDERMAL | 3 refills | Status: DC

## 2016-06-23 ENCOUNTER — Other Ambulatory Visit: Payer: Self-pay | Admitting: Nurse Practitioner

## 2016-06-23 DIAGNOSIS — Z1231 Encounter for screening mammogram for malignant neoplasm of breast: Secondary | ICD-10-CM

## 2016-07-31 ENCOUNTER — Ambulatory Visit
Admission: RE | Admit: 2016-07-31 | Discharge: 2016-07-31 | Disposition: A | Discharge status: Home / Self Care | Payer: BLUE CROSS/BLUE SHIELD | Source: Ambulatory Visit | Attending: Nurse Practitioner | Admitting: Nurse Practitioner

## 2016-07-31 DIAGNOSIS — Z1231 Encounter for screening mammogram for malignant neoplasm of breast: Secondary | ICD-10-CM

## 2016-09-23 ENCOUNTER — Telehealth: Payer: Self-pay | Admitting: Nurse Practitioner

## 2016-09-23 MED ORDER — ESTRADIOL-NORETHINDRONE ACET 0.05-0.14 MG/DAY TD PTTW: 1.0000 | MEDICATED_PATCH | TRANSDERMAL | 0 refills | Status: DC

## 2016-09-23 NOTE — Telephone Encounter (Signed)
Spoke with patient. Advised of message as seen below from [place]. Patient is agreeable and verbalizes understanding. States she is only using the Combipatch and is no longer using Estradiol patches as well. Is agreeable to switch to Combipatch with lower dose of progesterone. Rx for Combipatch 0.05/0.14 mg/day, place 1 patch twice weekly #24 0RF sent to pharmacy on file. 3 month recheck scheduled for 12/07/2016 at 1:45 pm with Dr.Jertson. Patient is agreeable to date and time.  Routing to provider for final review. Patient agreeable to disposition. Will close encounter.

## 2016-09-23 NOTE — Telephone Encounter (Signed)
Patient having some post menopausal bleeding.  Saw Pat[facility] and Dr Talbert Nan for this.  They changed how she used her patch but she is still having problems.

## 2016-09-23 NOTE — Telephone Encounter (Signed)
Reviewed the patient's chart and her sonohysterogram images. She had an atrophic endometrial biopsy in 8/17, negative pap and sonohysterogram in 9/17. Sonohysterogram even has good pictures of her cervix. Please confirm she is only using the combipatch (in the past she was using just estradiol patches for 2 weeks a month). If so we can try changing her to the lower progesterone dose combipatch. I thinks she is probably bleeding from the lining of her uterus being too thin.  Change to Combipatch 0.05/0.14mg /day, change the patch 2 x a week. Have her calendar all bleeding and set up a f/u appointment with me in 3 months. Thanks

## 2016-09-23 NOTE — Telephone Encounter (Signed)
Spoke with patient. Patient states that she has been using the Combiptach twice per week since 03/2017. Has had a EMB on 03/24/2016 that was c/w atrophy. [place] on 03/31/2016 was normal. Patient states since 03/2016 she has had ongoing dark brown spotting with intermittent bright red spotting. Denise heavy bleeding or pain. States this occurs almost every day. Will have 1-2 days with no symptoms. Patient woke up this morning and is having bright red spotting. Denies missing changing her patch or changing it late. Feels this is related to her uniform at work (see previous OV notes). Advised I will review this with Dr.Jertson and return call with further recommendations. Patient is agreeable.

## 2016-12-07 ENCOUNTER — Encounter: Payer: Self-pay | Admitting: Obstetrics and Gynecology

## 2016-12-07 ENCOUNTER — Ambulatory Visit (INDEPENDENT_AMBULATORY_CARE_PROVIDER_SITE_OTHER): Payer: BLUE CROSS/BLUE SHIELD | Admitting: Obstetrics and Gynecology

## 2016-12-07 VITALS — BP 92/58 | HR 64 | Resp 14 | Wt 139.0 lb

## 2016-12-07 DIAGNOSIS — Z79890 Hormone replacement therapy (postmenopausal): Secondary | ICD-10-CM

## 2016-12-07 DIAGNOSIS — N398 Other specified disorders of urinary system: Secondary | ICD-10-CM

## 2016-12-07 DIAGNOSIS — R3989 Other symptoms and signs involving the genitourinary system: Secondary | ICD-10-CM

## 2016-12-07 DIAGNOSIS — N950 Postmenopausal bleeding: Secondary | ICD-10-CM

## 2016-12-07 MED ORDER — ESTRADIOL-NORETHINDRONE ACET 0.05-0.14 MG/DAY TD PTTW: 1.0000 | MEDICATED_PATCH | TRANSDERMAL | 1 refills | Status: DC

## 2016-12-07 NOTE — Progress Notes (Signed)
GYNECOLOGY  VISIT   HPI: 56 y.o.   Married  Caucasian  female   [phone] with Patient's last menstrual period was 07/27/2008.   here for  Follow up HRT/ PMB. Patient is c/o pelvic pressure The patient was evaluated in the fall for PMP bleeding on HRT. Biopsy c/w atrophy, negative pap, sonohysterogram was negative. In 2/18 she was still spotting so her combipatch was changed to 0.05/0.14 mg/day. She continued to spot on the new dose into March, had some increase in bleeding for a few days (still very light). Stopped by the end of March. No bleeding at all in the last 6 weeks. Still have pressure in her lower abdomen, no pain. Intermittent, has it most days at some point. Lasts for hours. No urinary frequency, urgency or pain. She drinks a lot of water, voids normal amounts. No vaginal pressure. Occasionally has hesitancy to void, normal stream. More of an issue on public toilets where she doesn't sit down. She feels some of her symptoms are due to the chemicals in her work uniform. She is wearing her old uniform which she feels is safer.   She states the abdominal pressure has been there since last fall. Tolerable. Not getting worse.  She had a colonoscopy 4 years ago, had polyps, not sure when she is due for f/u.   Mostly has normal bowel functions unless she is working, that can cause constipation. She hasn't noticed any difference with the pressure with constipation.  Not sexually active, husband is disabled.  Daughter just turned 58, son is 36.   GYNECOLOGIC HISTORY: Patient's last menstrual period was 07/27/2008. Contraception:postmenopause  Menopausal hormone therapy: Combipatch         OB History    Gravida Para Term Preterm AB Living   4 2     2      SAB TAB Ectopic Multiple Live Births   2                 Patient Active Problem List   Diagnosis Date Noted  . Postmenopausal hormone replacement therapy 04/03/2014  . Unspecified hypothyroidism 04/03/2014  . Chest pain 11/14/2010     Past Medical History:  Diagnosis Date  . Chest pressure   . Dizziness   . Hypothyroidism   . Palpitations   . Renal calculi   . Thyroid tumor, benign 2014    Past Surgical History:  Procedure Laterality Date  . BIOPSY THYROID  03/2013   --benign--Dr. Chalmers Cater  . C-section x 1      Current Outpatient Prescriptions  Medication Sig Dispense Refill  . calcium carbonate 200 MG capsule Take 250 mg by mouth daily.      . cholecalciferol (VITAMIN D) 1000 UNITS tablet Take 1,000 Units by mouth daily.    Marland Kitchen estradiol-norethindrone (COMBIPATCH) 0.05-0.14 MG/DAY Place 1 patch onto the skin 2 (two) times a week. 24 patch 0  . fish oil-omega-3 fatty acids 1000 MG capsule Take 1 g by mouth daily.      Marland Kitchen loratadine (CLARITIN) 10 MG tablet Take 10 mg by mouth daily.     No current facility-administered medications for this visit.      ALLERGIES: Patient has no known allergies.  Family History  Problem Relation Age of Onset  . Osteoporosis Mother   . COPD Mother   . Bipolar disorder Sister   . Bipolar disorder Sister     Social History   Social History  . Marital status: Married    Spouse name:  N/A  . Number of children: N/A  . Years of education: N/A   Occupational History  . Not on file.   Social History Main Topics  . Smoking status: Former Smoker    Packs/day: 0.25    Years: 20.00  . Smokeless tobacco: Never Used     Comment: smoke occasionally  . Alcohol use 0.6 - 1.2 oz/week    1 - 2 Standard drinks or equivalent per week  . Drug use: No  . Sexual activity: No     Comment: husband paralyzed   Other Topics Concern  . Not on file   Social History Narrative   Husband has spinal cord injury about January 2012. He is in a wheelchair and disabled.  No bowel / bladder control.    Review of Systems  Eyes: Negative.   Respiratory: Negative.   Cardiovascular: Negative.   Gastrointestinal: Negative.   Genitourinary:       Pelvic pressure  Musculoskeletal:  Negative.   Neurological: Negative.   Endo/Heme/Allergies: Negative.   Psychiatric/Behavioral: Negative.     PHYSICAL EXAMINATION:    BP (!) 92/58 (BP Location: Right Arm, Patient Position: Sitting, Cuff Size: Normal)   Pulse 64   Resp 14   Wt 139 lb (63 kg)   LMP 07/27/2008   BMI 22.61 kg/m     General appearance: alert, cooperative and appears stated age Neck: no adenopathy, supple, symmetrical, trachea midline and thyroid normal to inspection and palpation Abdomen: soft, non-tender; bowel sounds normal; no masses,  no organomegaly  Pelvic: External genitalia:  no lesions              Urethra:  normal appearing urethra with no masses, tenderness or lesions              Bartholins and Skenes: normal                 Vagina: normal appearing vagina with normal color and discharge, no lesions. No prolapse              Cervix: no lesions              Bimanual Exam:  Uterus:  normal size, contour, position, consistency, mobility, non-tender              Adnexa: no mass, fullness, tenderness              Bladder: +/-tender  PVR: 10 cc  Chaperone was present for exam.  ASSESSMENT Postmenopausal bleeding seems to have stopped on the lower dose combipatch Suprapubic pressure, some hesitancy to void    PLAN Continue HRT If she has further bleeding would recommend a hysteroscopy, D&C PVR, normal Send urine for ua, c&s If pressure symptoms worsen will send to urology for a consultation   An After Visit Summary was printed and given to the patient.  20 minutes face to face time of which over 50% was spent in counseling.   CC: [name], NP

## 2016-12-08 LAB — URINE CULTURE: ORGANISM ID, BACTERIA: NO GROWTH

## 2016-12-08 LAB — URINALYSIS, MICROSCOPIC ONLY
Bacteria, UA: NONE SEEN [HPF]
CRYSTALS: NONE SEEN [HPF]
Casts: NONE SEEN [LPF]
Squamous Epithelial / LPF: NONE SEEN [HPF] (ref <=5)
WBC UA: NONE SEEN WBC/HPF (ref <=5)
Yeast: NONE SEEN [HPF]

## 2016-12-09 ENCOUNTER — Telehealth: Payer: Self-pay | Admitting: *Deleted

## 2016-12-09 NOTE — Telephone Encounter (Signed)
-----   Message from Yvonne Dom, MD sent at 12/09/2016  9:17 AM EDT ----- Please advise the patient of normal results. She should let us know if her symptoms worsen.

## 2016-12-09 NOTE — Telephone Encounter (Signed)
Left message to call regarding results -eh 

## 2016-12-14 NOTE — Telephone Encounter (Signed)
Spoke with patient and gave results. Patient voiced understanding -eh  

## 2016-12-15 ENCOUNTER — Other Ambulatory Visit: Payer: Self-pay | Admitting: Endocrinology

## 2016-12-15 DIAGNOSIS — E049 Nontoxic goiter, unspecified: Secondary | ICD-10-CM

## 2017-02-16 ENCOUNTER — Ambulatory Visit
Admission: RE | Admit: 2017-02-16 | Discharge: 2017-02-16 | Disposition: A | Discharge status: Home / Self Care | Payer: BLUE CROSS/BLUE SHIELD | Source: Ambulatory Visit | Attending: Endocrinology | Admitting: Endocrinology

## 2017-02-16 DIAGNOSIS — E049 Nontoxic goiter, unspecified: Secondary | ICD-10-CM

## 2017-04-14 ENCOUNTER — Other Ambulatory Visit: Payer: Self-pay | Admitting: Endocrinology

## 2017-04-14 DIAGNOSIS — E049 Nontoxic goiter, unspecified: Secondary | ICD-10-CM

## 2017-04-28 ENCOUNTER — Ambulatory Visit: Payer: BLUE CROSS/BLUE SHIELD | Admitting: Nurse Practitioner

## 2017-04-29 ENCOUNTER — Encounter: Payer: Self-pay | Admitting: Obstetrics and Gynecology

## 2017-04-29 ENCOUNTER — Ambulatory Visit (INDEPENDENT_AMBULATORY_CARE_PROVIDER_SITE_OTHER): Payer: BLUE CROSS/BLUE SHIELD | Admitting: Obstetrics and Gynecology

## 2017-04-29 VITALS — BP 102/62 | HR 68 | Resp 16 | Ht 65.75 in | Wt 142.0 lb

## 2017-04-29 DIAGNOSIS — Z0000 Encounter for general adult medical examination without abnormal findings: Secondary | ICD-10-CM | POA: Diagnosis not present

## 2017-04-29 DIAGNOSIS — Z01419 Encounter for gynecological examination (general) (routine) without abnormal findings: Secondary | ICD-10-CM | POA: Diagnosis not present

## 2017-04-29 DIAGNOSIS — N950 Postmenopausal bleeding: Secondary | ICD-10-CM

## 2017-04-29 DIAGNOSIS — Z79890 Hormone replacement therapy (postmenopausal): Secondary | ICD-10-CM | POA: Diagnosis not present

## 2017-04-29 DIAGNOSIS — M8580 Other specified disorders of bone density and structure, unspecified site: Secondary | ICD-10-CM | POA: Diagnosis not present

## 2017-04-29 MED ORDER — ESTRADIOL-NORETHINDRONE ACET 0.05-0.14 MG/DAY TD PTTW
1.0000 | MEDICATED_PATCH | TRANSDERMAL | 1 refills | Status: DC
Start: 2017-04-29 — End: 2018-05-16

## 2017-04-29 NOTE — Patient Instructions (Addendum)
EXERCISE AND DIET:  We recommended that you start or continue a regular exercise program for good health. Regular exercise means any activity that makes your heart beat faster and makes you sweat.  We recommend exercising at least 30 minutes per day at least 3 days a week, preferably 4 or 5.  We also recommend a diet low in fat and sugar.  Inactivity, poor dietary choices and obesity can cause diabetes, heart attack, stroke, and kidney damage, among others.    ALCOHOL AND SMOKING:  Women should limit their alcohol intake to no more than 7 drinks/beers/glasses of wine (combined, not each!) per week. Moderation of alcohol intake to this level decreases your risk of breast cancer and liver damage. And of course, no recreational drugs are part of a healthy lifestyle.  And absolutely no smoking or even second hand smoke. Most people know smoking can cause heart and lung diseases, but did you know it also contributes to weakening of your bones? Aging of your skin?  Yellowing of your teeth and nails?  CALCIUM AND VITAMIN D:  Adequate intake of calcium and Vitamin D are recommended.  The recommendations for exact amounts of these supplements seem to change often, but generally speaking 600 mg of calcium (either carbonate or citrate) and 800 units of Vitamin D per day seems prudent. Certain women may benefit from higher intake of Vitamin D.  If you are among these women, your doctor will have told you during your visit.    PAP SMEARS:  Pap smears, to check for cervical cancer or precancers,  have traditionally been done yearly, although recent scientific advances have shown that most women can have pap smears less often.  However, every woman still should have a physical exam from her gynecologist every year. It will include a breast check, inspection of the vulva and vagina to check for abnormal growths or skin changes, a visual exam of the cervix, and then an exam to evaluate the size and shape of the uterus and  ovaries.  And after 54 years of age, a rectal exam is indicated to check for rectal cancers. We will also provide age appropriate advice regarding health maintenance, like when you should have certain vaccines, screening for sexually transmitted diseases, bone density testing, colonoscopy, mammograms, etc.   MAMMOGRAMS:  All women over 54 years old should have a yearly mammogram. Many facilities now offer a "3D" mammogram, which may cost around $50 extra out of pocket. If possible,  we recommend you accept the option to have the 3D mammogram performed.  It both reduces the number of women who will be called back for extra views which then turn out to be normal, and it is better than the routine mammogram at detecting truly abnormal areas.    COLONOSCOPY:  Colonoscopy to screen for colon cancer is recommended for all women at age 54.  We know, you hate the idea of the prep.  We agree, BUT, having colon cancer and not knowing it is worse!!  Colon cancer so often starts as a polyp that can be seen and removed at colonscopy, which can quite literally save your life!  And if your first colonoscopy is normal and you have no family history of colon cancer, most women don't have to have it again for 10 years.  Once every ten years, you can do something that may end up saving your life, right?  We will be happy to help you get it scheduled when you are ready.    Be sure to check your insurance coverage so you understand how much it will cost.  It may be covered as a preventative service at no cost, but you should check your particular policy.      Breast Self-Awareness Breast self-awareness means being familiar with how your breasts look and feel. It involves checking your breasts regularly and reporting any changes to your health care provider. Practicing breast self-awareness is important. A change in your breasts can be a sign of a serious medical problem. Being familiar with how your breasts look and feel allows  you to find any problems early, when treatment is more likely to be successful. All women should practice breast self-awareness, including women who have had breast implants. How to do a breast self-exam One way to learn what is normal for your breasts and whether your breasts are changing is to do a breast self-exam. To do a breast self-exam: Look for Changes  1. Remove all the clothing above your waist. 2. Stand in front of a mirror in a room with good lighting. 3. Put your hands on your hips. 4. Push your hands firmly downward. 5. Compare your breasts in the mirror. Look for differences between them (asymmetry), such as: ? Differences in shape. ? Differences in size. ? Puckers, dips, and bumps in one breast and not the other. 6. Look at each breast for changes in your skin, such as: ? Redness. ? Scaly areas. 7. Look for changes in your nipples, such as: ? Discharge. ? Bleeding. ? Dimpling. ? Redness. ? A change in position. Feel for Changes  Carefully feel your breasts for lumps and changes. It is best to do this while lying on your back on the floor and again while sitting or standing in the shower or tub with soapy water on your skin. Feel each breast in the following way:  Place the arm on the side of the breast you are examining above your head.  Feel your breast with the other hand.  Start in the nipple area and make  inch (2 cm) overlapping circles to feel your breast. Use the pads of your three middle fingers to do this. Apply light pressure, then medium pressure, then firm pressure. The light pressure will allow you to feel the tissue closest to the skin. The medium pressure will allow you to feel the tissue that is a little deeper. The firm pressure will allow you to feel the tissue close to the ribs.  Continue the overlapping circles, moving downward over the breast until you feel your ribs below your breast.  Move one finger-width toward the center of the body.  Continue to use the  inch (2 cm) overlapping circles to feel your breast as you move slowly up toward your collarbone.  Continue the up and down exam using all three pressures until you reach your armpit.  Write Down What You Find  Write down what is normal for each breast and any changes that you find. Keep a written record with breast changes or normal findings for each breast. By writing this information down, you do not need to depend only on memory for size, tenderness, or location. Write down where you are in your menstrual cycle, if you are still menstruating. If you are having trouble noticing differences in your breasts, do not get discouraged. With time you will become more familiar with the variations in your breasts and more comfortable with the exam. How often should I examine my breasts? Examine   your breasts every month. If you are breastfeeding, the best time to examine your breasts is after a feeding or after using a breast pump. If you menstruate, the best time to examine your breasts is 5-7 days after your period is over. During your period, your breasts are lumpier, and it may be more difficult to notice changes. When should I see my health care provider? See your health care provider if you notice:  A change in shape or size of your breasts or nipples.  A change in the skin of your breast or nipples, such as a reddened or scaly area.  Unusual discharge from your nipples.  A lump or thick area that was not there before.  Pain in your breasts.  Anything that concerns you.  This information is not intended to replace advice given to you by your health care provider. Make sure you discuss any questions you have with your health care provider. Document Released: 07/13/2005 Document Revised: 12/19/2015 Document Reviewed: 06/02/2015 Elsevier Interactive Patient Education  2018 [employer]  

## 2017-04-29 NOTE — Progress Notes (Signed)
56 y.o. [id number] MarriedCaucasianF here for annual exam.  She was seen in the fall with PMP bleeding, atrophic biopsy and thin endometrium on ultrasound. She has had 3 episodes of minimal spotting since then. The first 2 were within a month or so of her evaluation, the last was in 8/18. She is on the combipatch, because she travels for work, sometimes she changes the combipatch late. The episode in 8/18 she thinks was related to changing the patch late. She thought the patch had to be kept cold, so at times would go 5 days prior to changing it.  She got hurt at work, a passenger fell on her leg. She has been out of work for a month. Just had an MRI, waiting for results. Still having pain, limiting her activity which is very frusting.  Not sexually active, husband with spinal cord injury. He has cauda equina, can walk some.     Patient's last menstrual period was 07/27/2008.          Sexually active: No.  The current method of family planning is post menopausal status.    Exercising: Yes.    cardio, walking, and small weight bearing Smoker:  no  Health Maintenance: Pap:  04/24/16 Pap and HR HPV negative History of abnormal Pap:  Yes, years ago MMG:  07/31/16 BIRADS 1 negative/density c Colonoscopy:  Around 2015 - normal per patient, f/u 5 years.  BMD:   12/13/12 -2.0 s/-0.8 l Osteopenia, on HRT TDaP:  2015 Gardasil: n/a    reports that she has quit smoking. She has a 5.00 pack-year smoking history. She has never used smokeless tobacco. She reports that she drinks about 0.6 - 1.2 oz of alcohol per week . She reports that she does not use drugs. 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   Past Medical History:  Diagnosis Date  . Chest pressure   . Dizziness   . Hypothyroidism   . Palpitations   . Renal calculi   . Thyroid tumor, benign 2014    Past Surgical History:  Procedure Laterality Date  . BIOPSY THYROID  03/2013   --benign--Dr. Chalmers Cater  . C-section x 1      Current  Outpatient Prescriptions  Medication Sig Dispense Refill  . calcium carbonate 200 MG capsule Take 250 mg by mouth daily.      . cholecalciferol (VITAMIN D) 1000 UNITS tablet Take 1,000 Units by mouth daily.    Marland Kitchen estradiol-norethindrone (COMBIPATCH) 0.05-0.14 MG/DAY Place 1 patch onto the skin 2 (two) times a week. 24 patch 1  . fish oil-omega-3 fatty acids 1000 MG capsule Take 1 g by mouth daily.      Marland Kitchen loratadine (CLARITIN) 10 MG tablet Take 10 mg by mouth daily.     No current facility-administered medications for this visit.     Family History  Problem Relation Age of Onset  . Osteoporosis Mother   . COPD Mother   . Bipolar disorder Sister   . Bipolar disorder Sister     Review of Systems  Constitutional: Negative.   HENT: Negative.   Eyes: Negative.   Respiratory: Negative.   Cardiovascular: Negative.   Gastrointestinal: Negative.   Endocrine: Negative.   Genitourinary: Negative.   Musculoskeletal: Negative.   Skin: Negative.   Allergic/Immunologic: Negative.   Neurological: Negative.   Hematological: Negative.   Psychiatric/Behavioral: Negative.     Exam:   BP 102/62 (BP Location: Right Arm, Patient Position: Sitting, Cuff Size: Normal)  Pulse 68   Resp 16   Ht 5' 5.75" (1.67 m)   Wt 142 lb (64.4 kg)   LMP 07/27/2008   BMI 23.09 kg/m   Weight change: @WEIGHTCHANGE @ Height:   Height: 5' 5.75" (167 cm)  Ht Readings from Last 3 Encounters:  04/29/17 5' 5.75" (1.67 m)  04/24/16 5' 5.75" (1.67 m)  04/23/15 5' 6.25" (1.683 m)    General appearance: alert, cooperative and appears stated age Head: Normocephalic, without obvious abnormality, atraumatic Neck: no adenopathy, supple, symmetrical, trachea midline and thyroid normal to inspection and palpation Lungs: clear to auscultation bilaterally Cardiovascular: regular rate and rhythm Breasts: normal appearance, no masses or tenderness Abdomen: soft, non-tender; non distended,  no masses,  no  organomegaly Extremities: extremities normal, atraumatic, no cyanosis or edema Skin: Skin color, texture, turgor normal. No rashes or lesions Lymph nodes: Cervical, supraclavicular, and axillary nodes normal. No abnormal inguinal nodes palpated Neurologic: Grossly normal   Pelvic: External genitalia:  no lesions              Urethra:  normal appearing urethra with no masses, tenderness or lesions              Bartholins and Skenes: normal                 Vagina: normal appearing vagina with normal color and discharge, no lesions              Cervix: no lesions, small area at 7 o'clock that looks slightly friable, treated with silver nitrate               Bimanual Exam:  Uterus:  normal size, contour, position, consistency, mobility, non-tender and anteverted              Adnexa: no mass, fullness, tenderness               Rectovaginal: Confirms               Anus:  normal sphincter tone, no lesions  Chaperone was present for exam.  A:  Well Woman with normal exam  Osteopenia  HRT  PMP spotting, negative evaluation in the fall. The last episode she thinks she changed her patch late.   P:   Continue HRT  If she has any further spotting, would recommend further evaluation with hysteroscopy, D&C  She will make sure to change the patch at least every 4 days.   No pap this year  Mammogram in 1/19   DEXA  Discussed breast self exam  Discussed calcium and vit D intake  Normal lipids last year  Screening labs

## 2017-04-30 LAB — CBC
HEMATOCRIT: 41.9 % (ref 34.0–46.6)
HEMOGLOBIN: 13.5 g/dL (ref 11.1–15.9)
MCH: 31 pg (ref 26.6–33.0)
MCHC: 32.2 g/dL (ref 31.5–35.7)
MCV: 96 fL (ref 79–97)
PLATELETS: 227 10*3/uL (ref 150–379)
RBC: 4.35 x10E6/uL (ref 3.77–5.28)
RDW: 12.9 % (ref 12.3–15.4)
WBC: 5 10*3/uL (ref 3.4–10.8)

## 2017-04-30 LAB — COMPREHENSIVE METABOLIC PANEL
A/G RATIO: 2 (ref 1.2–2.2)
ALBUMIN: 4.3 g/dL (ref 3.5–5.5)
ALT: 10 IU/L (ref 0–32)
AST: 14 IU/L (ref 0–40)
Alkaline Phosphatase: 47 IU/L (ref 39–117)
BILIRUBIN TOTAL: 0.2 mg/dL (ref 0.0–1.2)
BUN / CREAT RATIO: 15 (ref 9–23)
BUN: 13 mg/dL (ref 6–24)
CALCIUM: 9 mg/dL (ref 8.7–10.2)
CO2: 25 mmol/L (ref 20–29)
Chloride: 106 mmol/L (ref 96–106)
Creatinine, Ser: 0.88 mg/dL (ref 0.57–1.00)
GFR calc non Af Amer: 75 mL/min/{1.73_m2} (ref >59)
GFR, EST AFRICAN AMERICAN: 86 mL/min/{1.73_m2} (ref >59)
Globulin, Total: 2.2 g/dL (ref 1.5–4.5)
Glucose: 97 mg/dL (ref 65–99)
POTASSIUM: 4.3 mmol/L (ref 3.5–5.2)
Sodium: 146 mmol/L — ABNORMAL HIGH (ref 134–144)
TOTAL PROTEIN: 6.5 g/dL (ref 6.0–8.5)

## 2017-05-06 ENCOUNTER — Other Ambulatory Visit: Payer: Self-pay | Admitting: Obstetrics and Gynecology

## 2017-05-06 ENCOUNTER — Telehealth: Payer: Self-pay | Admitting: Obstetrics and Gynecology

## 2017-05-06 DIAGNOSIS — M8580 Other specified disorders of bone density and structure, unspecified site: Secondary | ICD-10-CM

## 2017-05-06 NOTE — Telephone Encounter (Signed)
Order placed for BMD at [facility], last BMD 11/2012, Hx of osteopenia. Call to patient to notify. Advised patient to call [facility] directly for scheduling. Patient verbalizes understanding and is agreeable.   Routing to provider for final review. Patient is agreeable to disposition. Will close encounter.

## 2017-05-06 NOTE — Telephone Encounter (Signed)
Patient called and requested an order for a bone density test. She said she thinks Dr. Talbert Nan wanted to send her to the [facility].

## 2017-05-26 ENCOUNTER — Ambulatory Visit
Admission: RE | Admit: 2017-05-26 | Discharge: 2017-05-26 | Disposition: A | Discharge status: Home / Self Care | Payer: BLUE CROSS/BLUE SHIELD | Source: Ambulatory Visit | Attending: Obstetrics and Gynecology | Admitting: Obstetrics and Gynecology

## 2017-05-26 DIAGNOSIS — M8580 Other specified disorders of bone density and structure, unspecified site: Secondary | ICD-10-CM

## 2017-05-27 ENCOUNTER — Telehealth: Payer: Self-pay | Admitting: *Deleted

## 2017-05-27 NOTE — Telephone Encounter (Signed)
-----   Message from Salvadore Dom, MD sent at 05/26/2017  5:20 PM EDT ----- Please inform the patient that she still has osteopenia. Her 10 year risk of any fracture is 6.5% and 0.6% for a hip fracture. No treatment is recommended. She should continue her calcium and vit D and regular exercise. Given that she is on HRT, she doesn't need a DEXA in 2 years, I would space it to 3-5 years. If she goes off ERT, then I would repeat her DEXA in 2 years.

## 2017-05-27 NOTE — Telephone Encounter (Signed)
Patient returning call.

## 2017-05-27 NOTE — Telephone Encounter (Signed)
Spoke with patient and went over results. Patient voiced understanding -eh

## 2017-05-27 NOTE — Telephone Encounter (Signed)
Left message to call regarding lab results -eh 

## 2017-06-30 ENCOUNTER — Other Ambulatory Visit: Payer: Self-pay | Admitting: Obstetrics and Gynecology

## 2017-06-30 DIAGNOSIS — Z1231 Encounter for screening mammogram for malignant neoplasm of breast: Secondary | ICD-10-CM

## 2017-08-03 ENCOUNTER — Ambulatory Visit
Admission: RE | Admit: 2017-08-03 | Discharge: 2017-08-03 | Disposition: A | Discharge status: Home / Self Care | Payer: BLUE CROSS/BLUE SHIELD | Source: Ambulatory Visit | Attending: Obstetrics and Gynecology | Admitting: Obstetrics and Gynecology

## 2017-08-03 DIAGNOSIS — Z1231 Encounter for screening mammogram for malignant neoplasm of breast: Secondary | ICD-10-CM

## 2018-03-05 ENCOUNTER — Other Ambulatory Visit: Payer: Self-pay | Admitting: Endocrinology

## 2018-03-05 DIAGNOSIS — E049 Nontoxic goiter, unspecified: Secondary | ICD-10-CM

## 2018-04-11 ENCOUNTER — Ambulatory Visit
Admission: RE | Admit: 2018-04-11 | Discharge: 2018-04-11 | Disposition: A | Discharge status: Home / Self Care | Payer: BLUE CROSS/BLUE SHIELD | Source: Ambulatory Visit | Attending: Endocrinology | Admitting: Endocrinology

## 2018-04-11 DIAGNOSIS — E049 Nontoxic goiter, unspecified: Secondary | ICD-10-CM

## 2018-05-10 NOTE — Progress Notes (Signed)
57 y.o. [id number] Married White or Caucasian Not Hispanic or Latino female here for annual exam.   She is been off of the combipatch for a few months, tolerable vasomotor symptoms. Lots of stress, 73 year old son is in rehab in [place]. Husband is an Firefighter for him. She is seeing a therapist, has a strong faith and lots of support.  She has some mild nerve damage from a work injury last year.  Not sexually active, husband with spinal cord injury (cauda equina, able to walk some). No vaginal bleeding.     Patient's last menstrual period was 07/27/2008.          Sexually active: No.  The current method of family planning is post menopausal status.    Exercising: Yes.    walking Smoker:  no  Health Maintenance: Pap:  04/24/16 Pap and HR HPV negative History of abnormal Pap:  Yes, years ago MMG:  08/03/2017 Birads 1 negative Colonoscopy:  Around 2015 - normal per patient, f/u 5 years.  BMD:   12/13/12 -2.0 s/-0.8 l Osteopenia, on HRT 05/26/17 DEXA T score -1.7, FRAX 6.5%/0.6% TDaP:  2015 Gardasil: n/a    reports that she has quit smoking. She has a 5.00 pack-year smoking history. She has never used smokeless tobacco. She reports that she drinks about 1.0 - 2.0 standard drinks of alcohol per week. She reports that she does not use drugs. 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   Past Medical History:  Diagnosis Date  . Chest pressure   . Dizziness   . Hypothyroidism   . Palpitations   . Renal calculi   . Thyroid tumor, benign 2014    Past Surgical History:  Procedure Laterality Date  . BIOPSY THYROID  03/2013   --benign--Dr. Chalmers Cater  . C-section x 1      Current Outpatient Medications  Medication Sig Dispense Refill  . calcium carbonate 200 MG capsule Take 250 mg by mouth daily.      . cholecalciferol (VITAMIN D) 1000 UNITS tablet Take 1,000 Units by mouth daily.    . fish oil-omega-3 fatty acids 1000 MG capsule Take 1 g by mouth daily.      Marland Kitchen loratadine (CLARITIN) 10 MG  tablet Take 10 mg by mouth daily.     No current facility-administered medications for this visit.     Family History  Problem Relation Age of Onset  . Osteoporosis Mother   . COPD Mother   . Bipolar disorder Sister   . Bipolar disorder Sister   . Breast cancer Neg Hx     Review of Systems  Constitutional: Negative.   HENT: Negative.   Eyes: Negative.   Respiratory: Negative.   Cardiovascular: Negative.   Gastrointestinal: Negative.   Endocrine: Negative.   Genitourinary: Negative.   Musculoskeletal: Negative.   Allergic/Immunologic: Negative.  Negative for environmental allergies.  Neurological: Negative.   Hematological: Negative.   Psychiatric/Behavioral: Negative.     Exam:   BP 104/64 (BP Location: Right Arm, Patient Position: Sitting, Cuff Size: Normal)   Pulse (!) 56   Ht 5' 5.35" (1.66 m)   Wt 143 lb 3.2 oz (65 kg)   LMP 07/27/2008   BMI 23.57 kg/m   Weight change: @WEIGHTCHANGE @ Height:   Height: 5' 5.35" (166 cm)  Ht Readings from Last 3 Encounters:  05/16/18 5' 5.35" (1.66 m)  04/29/17 5' 5.75" (1.67 m)  04/24/16 5' 5.75" (1.67 m)    General appearance: alert, cooperative  and appears stated age Head: Normocephalic, without obvious abnormality, atraumatic Neck: no adenopathy, supple, symmetrical, trachea midline and thyroid normal to inspection and palpation Lungs: clear to auscultation bilaterally Cardiovascular: regular rate and rhythm Breasts: normal appearance, no masses or tenderness Abdomen: soft, non-tender; non distended,  no masses,  no organomegaly Extremities: extremities normal, atraumatic, no cyanosis or edema Skin: Skin color, texture, turgor normal. No rashes or lesions Lymph nodes: Cervical, supraclavicular, and axillary nodes normal. No abnormal inguinal nodes palpated Neurologic: Grossly normal   Pelvic: External genitalia:  no lesions              Urethra:  normal appearing urethra with no masses, tenderness or lesions               Bartholins and Skenes: normal                 Vagina: normal appearing vagina with normal color and discharge, no lesions              Cervix: no lesions               Bimanual Exam:  Uterus:  normal size, contour, position, consistency, mobility, non-tender              Adnexa: no mass, fullness, tenderness               Rectovaginal: Confirms               Anus:  normal sphincter tone, no lesions  Chaperone was present for exam.  A:  Well Woman with normal exam  Stopped HRT  Under tremendous stress, has good support.   P:   Pap next year  Mammogram in 1/20  Discussed breast self exam  Discussed calcium and vit D intake  Colonoscopy due in 2020  Declines antidepressants

## 2018-05-16 ENCOUNTER — Encounter: Payer: Self-pay | Admitting: Obstetrics and Gynecology

## 2018-05-16 ENCOUNTER — Ambulatory Visit: Payer: BLUE CROSS/BLUE SHIELD | Admitting: Obstetrics and Gynecology

## 2018-05-16 ENCOUNTER — Other Ambulatory Visit: Payer: Self-pay

## 2018-05-16 VITALS — BP 104/64 | HR 56 | Ht 65.35 in | Wt 143.2 lb

## 2018-05-16 DIAGNOSIS — Z0000 Encounter for general adult medical examination without abnormal findings: Secondary | ICD-10-CM

## 2018-05-16 DIAGNOSIS — Z01419 Encounter for gynecological examination (general) (routine) without abnormal findings: Secondary | ICD-10-CM | POA: Diagnosis not present

## 2018-05-16 DIAGNOSIS — E559 Vitamin D deficiency, unspecified: Secondary | ICD-10-CM | POA: Diagnosis not present

## 2018-05-16 NOTE — Patient Instructions (Signed)
EXERCISE AND DIET:  We recommended that you start or continue a regular exercise program for good health. Regular exercise means any activity that makes your heart beat faster and makes you sweat.  We recommend exercising at least 30 minutes per day at least 3 days a week, preferably 4 or 5.  We also recommend a diet low in fat and sugar.  Inactivity, poor dietary choices and obesity can cause diabetes, heart attack, stroke, and kidney damage, among others.    ALCOHOL AND SMOKING:  Women should limit their alcohol intake to no more than 7 drinks/beers/glasses of wine (combined, not each!) per week. Moderation of alcohol intake to this level decreases your risk of breast cancer and liver damage. And of course, no recreational drugs are part of a healthy lifestyle.  And absolutely no smoking or even second hand smoke. Most people know smoking can cause heart and lung diseases, but did you know it also contributes to weakening of your bones? Aging of your skin?  Yellowing of your teeth and nails?  CALCIUM AND VITAMIN D:  Adequate intake of calcium and Vitamin D are recommended.  The recommendations for exact amounts of these supplements seem to change often, but generally speaking 600 mg of calcium (either carbonate or citrate) and 800 units of Vitamin D per day seems prudent. Certain women may benefit from higher intake of Vitamin D.  If you are among these women, your doctor will have told you during your visit.    PAP SMEARS:  Pap smears, to check for cervical cancer or precancers,  have traditionally been done yearly, although recent scientific advances have shown that most women can have pap smears less often.  However, every woman still should have a physical exam from her gynecologist every year. It will include a breast check, inspection of the vulva and vagina to check for abnormal growths or skin changes, a visual exam of the cervix, and then an exam to evaluate the size and shape of the uterus and  ovaries.  And after 55 years of age, a rectal exam is indicated to check for rectal cancers. We will also provide age appropriate advice regarding health maintenance, like when you should have certain vaccines, screening for sexually transmitted diseases, bone density testing, colonoscopy, mammograms, etc.   MAMMOGRAMS:  All women over 40 years old should have a yearly mammogram. Many facilities now offer a "3D" mammogram, which may cost around $50 extra out of pocket. If possible,  we recommend you accept the option to have the 3D mammogram performed.  It both reduces the number of women who will be called back for extra views which then turn out to be normal, and it is better than the routine mammogram at detecting truly abnormal areas.    COLONOSCOPY:  Colonoscopy to screen for colon cancer is recommended for all women at age 50.  We know, you hate the idea of the prep.  We agree, BUT, having colon cancer and not knowing it is worse!!  Colon cancer so often starts as a polyp that can be seen and removed at colonscopy, which can quite literally save your life!  And if your first colonoscopy is normal and you have no family history of colon cancer, most women don't have to have it again for 10 years.  Once every ten years, you can do something that may end up saving your life, right?  We will be happy to help you get it scheduled when you are ready.    Be sure to check your insurance coverage so you understand how much it will cost.  It may be covered as a preventative service at no cost, but you should check your particular policy.      Breast Self-Awareness Breast self-awareness means being familiar with how your breasts look and feel. It involves checking your breasts regularly and reporting any changes to your health care provider. Practicing breast self-awareness is important. A change in your breasts can be a sign of a serious medical problem. Being familiar with how your breasts look and feel allows  you to find any problems early, when treatment is more likely to be successful. All women should practice breast self-awareness, including women who have had breast implants. How to do a breast self-exam One way to learn what is normal for your breasts and whether your breasts are changing is to do a breast self-exam. To do a breast self-exam: Look for Changes  1. Remove all the clothing above your waist. 2. Stand in front of a mirror in a room with good lighting. 3. Put your hands on your hips. 4. Push your hands firmly downward. 5. Compare your breasts in the mirror. Look for differences between them (asymmetry), such as: ? Differences in shape. ? Differences in size. ? Puckers, dips, and bumps in one breast and not the other. 6. Look at each breast for changes in your skin, such as: ? Redness. ? Scaly areas. 7. Look for changes in your nipples, such as: ? Discharge. ? Bleeding. ? Dimpling. ? Redness. ? A change in position. Feel for Changes  Carefully feel your breasts for lumps and changes. It is best to do this while lying on your back on the floor and again while sitting or standing in the shower or tub with soapy water on your skin. Feel each breast in the following way:  Place the arm on the side of the breast you are examining above your head.  Feel your breast with the other hand.  Start in the nipple area and make  inch (2 cm) overlapping circles to feel your breast. Use the pads of your three middle fingers to do this. Apply light pressure, then medium pressure, then firm pressure. The light pressure will allow you to feel the tissue closest to the skin. The medium pressure will allow you to feel the tissue that is a little deeper. The firm pressure will allow you to feel the tissue close to the ribs.  Continue the overlapping circles, moving downward over the breast until you feel your ribs below your breast.  Move one finger-width toward the center of the body.  Continue to use the  inch (2 cm) overlapping circles to feel your breast as you move slowly up toward your collarbone.  Continue the up and down exam using all three pressures until you reach your armpit.  Write Down What You Find  Write down what is normal for each breast and any changes that you find. Keep a written record with breast changes or normal findings for each breast. By writing this information down, you do not need to depend only on memory for size, tenderness, or location. Write down where you are in your menstrual cycle, if you are still menstruating. If you are having trouble noticing differences in your breasts, do not get discouraged. With time you will become more familiar with the variations in your breasts and more comfortable with the exam. How often should I examine my breasts? Examine   your breasts every month. If you are breastfeeding, the best time to examine your breasts is after a feeding or after using a breast pump. If you menstruate, the best time to examine your breasts is 5-7 days after your period is over. During your period, your breasts are lumpier, and it may be more difficult to notice changes. When should I see my health care provider? See your health care provider if you notice:  A change in shape or size of your breasts or nipples.  A change in the skin of your breast or nipples, such as a reddened or scaly area.  Unusual discharge from your nipples.  A lump or thick area that was not there before.  Pain in your breasts.  Anything that concerns you.  This information is not intended to replace advice given to you by your health care provider. Make sure you discuss any questions you have with your health care provider. Document Released: 07/13/2005 Document Revised: 12/19/2015 Document Reviewed: 06/02/2015 Elsevier Interactive Patient Education  2018 [employer]  

## 2018-05-17 LAB — CBC
Hematocrit: 42 % (ref 34.0–46.6)
Hemoglobin: 14.1 g/dL (ref 11.1–15.9)
MCH: 31.2 pg (ref 26.6–33.0)
MCHC: 33.6 g/dL (ref 31.5–35.7)
MCV: 93 fL (ref 79–97)
PLATELETS: 194 10*3/uL (ref 150–450)
RBC: 4.52 x10E6/uL (ref 3.77–5.28)
RDW: 12.5 % (ref 12.3–15.4)
WBC: 5.3 10*3/uL (ref 3.4–10.8)

## 2018-05-17 LAB — COMPREHENSIVE METABOLIC PANEL
A/G RATIO: 2.4 — AB (ref 1.2–2.2)
ALK PHOS: 54 IU/L (ref 39–117)
ALT: 12 IU/L (ref 0–32)
AST: 15 IU/L (ref 0–40)
Albumin: 4.7 g/dL (ref 3.5–5.5)
BILIRUBIN TOTAL: 0.3 mg/dL (ref 0.0–1.2)
BUN/Creatinine Ratio: 17 (ref 9–23)
BUN: 14 mg/dL (ref 6–24)
CHLORIDE: 101 mmol/L (ref 96–106)
CO2: 25 mmol/L (ref 20–29)
Calcium: 9.5 mg/dL (ref 8.7–10.2)
Creatinine, Ser: 0.81 mg/dL (ref 0.57–1.00)
GFR calc Af Amer: 95 mL/min/{1.73_m2} (ref >59)
GFR, EST NON AFRICAN AMERICAN: 82 mL/min/{1.73_m2} (ref >59)
Globulin, Total: 2 g/dL (ref 1.5–4.5)
Glucose: 81 mg/dL (ref 65–99)
POTASSIUM: 4.1 mmol/L (ref 3.5–5.2)
Sodium: 143 mmol/L (ref 134–144)
Total Protein: 6.7 g/dL (ref 6.0–8.5)

## 2018-05-17 LAB — LIPID PANEL
CHOLESTEROL TOTAL: 193 mg/dL (ref 100–199)
Chol/HDL Ratio: 2.7 ratio (ref 0.0–4.4)
HDL: 72 mg/dL (ref >39)
LDL Calculated: 95 mg/dL (ref 0–99)
TRIGLYCERIDES: 131 mg/dL (ref 0–149)
VLDL Cholesterol Cal: 26 mg/dL (ref 5–40)

## 2018-05-17 LAB — VITAMIN D 25 HYDROXY (VIT D DEFICIENCY, FRACTURES): Vit D, 25-Hydroxy: 29.6 ng/mL — ABNORMAL LOW (ref 30.0–100.0)

## 2018-05-18 ENCOUNTER — Ambulatory Visit: Payer: BLUE CROSS/BLUE SHIELD | Admitting: Obstetrics and Gynecology

## 2018-09-02 ENCOUNTER — Other Ambulatory Visit: Payer: Self-pay | Admitting: Obstetrics and Gynecology

## 2018-09-02 DIAGNOSIS — Z1231 Encounter for screening mammogram for malignant neoplasm of breast: Secondary | ICD-10-CM

## 2018-09-16 ENCOUNTER — Ambulatory Visit: Payer: BLUE CROSS/BLUE SHIELD

## 2018-09-16 ENCOUNTER — Ambulatory Visit
Admission: RE | Admit: 2018-09-16 | Discharge: 2018-09-16 | Disposition: A | Discharge status: Home / Self Care | Payer: BLUE CROSS/BLUE SHIELD | Source: Ambulatory Visit | Attending: Obstetrics and Gynecology | Admitting: Obstetrics and Gynecology

## 2018-09-16 DIAGNOSIS — Z1231 Encounter for screening mammogram for malignant neoplasm of breast: Secondary | ICD-10-CM

## 2019-05-23 ENCOUNTER — Other Ambulatory Visit: Payer: Self-pay

## 2019-05-23 NOTE — Progress Notes (Signed)
58 y.o. [id number] Married White or Caucasian Not Hispanic or Latino female here for annual exam.  Would like labs today.  Not sexually active, husband with nerve damage from work injury. They are getting divorced, getting along okay. She has moved out. Son is clean. Kids are doing okay.     Patient's last menstrual period was 07/27/2008.          Sexually active: No.  The current method of family planning is post menopausal status.    Exercising: Yes.    walking Smoker:  no  Health Maintenance: Pap:04/24/16 Pap and HR HPV negative History of abnormal Pap:Yes, years ago MMG:09/16/2018 Birads 1 negative Colonoscopy:Around 2015 - normal per patient, f/u 5 years.She will schedule. BMD:05/26/17 DEXA T score -1.7, FRAX 6.5%/0.6% [id number] Gardasil:n/a   reports that she has quit smoking. She has a 5.00 pack-year smoking history. She has never used smokeless tobacco. She reports current alcohol use of about 1.0 - 2.0 standard drinks of alcohol per week. She reports that she does not use drugs. Works as a Catering manager. Kids are 15 and 16.  Past Medical History:  Diagnosis Date  . Chest pressure   . Dizziness   . Hypothyroidism   . Palpitations   . Renal calculi   . Thyroid tumor, benign 2014    Past Surgical History:  Procedure Laterality Date  . BIOPSY THYROID  03/2013   --benign--Dr. Chalmers Cater  . C-section x 1      Current Outpatient Medications  Medication Sig Dispense Refill  . calcium carbonate 200 MG capsule Take 250 mg by mouth daily.      . cholecalciferol (VITAMIN D) 1000 UNITS tablet Take 1,000 Units by mouth daily.    Marland Kitchen loratadine (CLARITIN) 10 MG tablet Take 10 mg by mouth daily.     No current facility-administered medications for this visit.     Family History  Problem Relation Age of Onset  . Osteoporosis Mother   . COPD Mother   . Bipolar disorder Sister   . Bipolar disorder Sister   . Breast cancer Neg Hx     Review of Systems   Constitutional: Negative.   HENT: Negative.   Eyes: Negative.   Respiratory: Negative.   Cardiovascular: Negative.   Gastrointestinal: Negative.   Endocrine: Negative.   Genitourinary: Negative.   Musculoskeletal: Negative.   Skin: Negative.   Allergic/Immunologic: Negative.   Neurological: Negative.   Hematological: Negative.   Psychiatric/Behavioral: Negative.     Exam:   BP 108/68 (BP Location: Right Arm, Patient Position: Sitting, Cuff Size: Normal)   Pulse 64   Temp (!) 97.2 F (36.2 C) (Skin)   Ht 5' 6.25" (1.683 m)   Wt 141 lb (64 kg)   LMP 07/27/2008   BMI 22.59 kg/m   Weight change: @WEIGHTCHANGE @ Height:   Height: 5' 6.25" (168.3 cm)  Ht Readings from Last 3 Encounters:  05/24/19 5' 6.25" (1.683 m)  05/16/18 5' 5.35" (1.66 m)  04/29/17 5' 5.75" (1.67 m)    General appearance: alert, cooperative and appears stated age Head: Normocephalic, without obvious abnormality, atraumatic Neck: no adenopathy, supple, symmetrical, trachea midline and thyroid normal to inspection and palpation Lungs: clear to auscultation bilaterally Cardiovascular: regular rate and rhythm Breasts: normal appearance, no masses or tenderness Abdomen: soft, non-tender; non distended,  no masses,  no organomegaly Extremities: extremities normal, atraumatic, no cyanosis or edema Skin: Skin color, texture, turgor normal. No rashes or lesions Lymph nodes: Cervical, supraclavicular, and axillary nodes normal.  No abnormal inguinal nodes palpated Neurologic: Grossly normal   Pelvic: External genitalia:  no lesions              Urethra:  normal appearing urethra with no masses, tenderness or lesions              Bartholins and Skenes: normal                 Vagina: atrophic appearing vagina with normal color and discharge, no lesions              Cervix: no lesions               Bimanual Exam:  Uterus:  normal size, contour, position, consistency, mobility, non-tender              Adnexa: no  mass, fullness, tenderness               Rectovaginal: Confirms               Anus:  normal sphincter tone, no lesions  Chaperone was present for exam.  A:  Well Woman with normal exam  Osteopenia  Vit d def  P:   No pap this year  Discussed breast self exam  Discussed calcium and vit D intake  Mammogram in 2/21  Colonoscopy due, she will schedule  Screening labs today  Vit d  DEXA ordered

## 2019-05-24 ENCOUNTER — Ambulatory Visit: Payer: BC Managed Care – PPO | Admitting: Obstetrics and Gynecology

## 2019-05-24 ENCOUNTER — Encounter: Payer: Self-pay | Admitting: Obstetrics and Gynecology

## 2019-05-24 VITALS — BP 108/68 | HR 64 | Temp 97.2°F | Ht 66.25 in | Wt 141.0 lb

## 2019-05-24 DIAGNOSIS — M8580 Other specified disorders of bone density and structure, unspecified site: Secondary | ICD-10-CM

## 2019-05-24 DIAGNOSIS — E559 Vitamin D deficiency, unspecified: Secondary | ICD-10-CM

## 2019-05-24 DIAGNOSIS — Z0000 Encounter for general adult medical examination without abnormal findings: Secondary | ICD-10-CM | POA: Diagnosis not present

## 2019-05-24 DIAGNOSIS — Z01419 Encounter for gynecological examination (general) (routine) without abnormal findings: Secondary | ICD-10-CM

## 2019-05-24 NOTE — Patient Instructions (Signed)

## 2019-05-25 LAB — COMPREHENSIVE METABOLIC PANEL
ALT: 15 IU/L (ref 0–32)
AST: 17 IU/L (ref 0–40)
Albumin/Globulin Ratio: 2.2 (ref 1.2–2.2)
Albumin: 4.8 g/dL (ref 3.8–4.9)
Alkaline Phosphatase: 79 IU/L (ref 39–117)
BUN/Creatinine Ratio: 15 (ref 9–23)
BUN: 12 mg/dL (ref 6–24)
Bilirubin Total: 0.4 mg/dL (ref 0.0–1.2)
CO2: 27 mmol/L (ref 20–29)
Calcium: 9.3 mg/dL (ref 8.7–10.2)
Chloride: 102 mmol/L (ref 96–106)
Creatinine, Ser: 0.79 mg/dL (ref 0.57–1.00)
GFR calc Af Amer: 97 mL/min/{1.73_m2} (ref >59)
GFR calc non Af Amer: 84 mL/min/{1.73_m2} (ref >59)
Globulin, Total: 2.2 g/dL (ref 1.5–4.5)
Glucose: 97 mg/dL (ref 65–99)
Potassium: 4.2 mmol/L (ref 3.5–5.2)
Sodium: 142 mmol/L (ref 134–144)
Total Protein: 7 g/dL (ref 6.0–8.5)

## 2019-05-25 LAB — CBC
Hematocrit: 42.7 % (ref 34.0–46.6)
Hemoglobin: 14.2 g/dL (ref 11.1–15.9)
MCH: 31.1 pg (ref 26.6–33.0)
MCHC: 33.3 g/dL (ref 31.5–35.7)
MCV: 94 fL (ref 79–97)
Platelets: 206 10*3/uL (ref 150–450)
RBC: 4.56 x10E6/uL (ref 3.77–5.28)
RDW: 12.4 % (ref 11.7–15.4)
WBC: 4.9 10*3/uL (ref 3.4–10.8)

## 2019-05-25 LAB — LIPID PANEL
Chol/HDL Ratio: 2.5 ratio (ref 0.0–4.4)
Cholesterol, Total: 207 mg/dL — ABNORMAL HIGH (ref 100–199)
HDL: 84 mg/dL (ref >39)
LDL Chol Calc (NIH): 110 mg/dL — ABNORMAL HIGH (ref 0–99)
Triglycerides: 73 mg/dL (ref 0–149)
VLDL Cholesterol Cal: 13 mg/dL (ref 5–40)

## 2019-05-25 LAB — VITAMIN D 25 HYDROXY (VIT D DEFICIENCY, FRACTURES): Vit D, 25-Hydroxy: 45 ng/mL (ref 30.0–100.0)

## 2019-05-26 ENCOUNTER — Other Ambulatory Visit: Payer: Self-pay

## 2019-05-26 DIAGNOSIS — Z20822 Contact with and (suspected) exposure to covid-19: Secondary | ICD-10-CM

## 2019-05-27 LAB — NOVEL CORONAVIRUS, NAA: SARS-CoV-2, NAA: NOT DETECTED

## 2019-05-31 ENCOUNTER — Other Ambulatory Visit: Payer: BLUE CROSS/BLUE SHIELD

## 2019-06-14 ENCOUNTER — Other Ambulatory Visit: Payer: BLUE CROSS/BLUE SHIELD

## 2019-06-29 ENCOUNTER — Other Ambulatory Visit: Payer: Self-pay

## 2019-06-29 ENCOUNTER — Ambulatory Visit
Admission: RE | Admit: 2019-06-29 | Discharge: 2019-06-29 | Disposition: A | Discharge status: Home / Self Care | Payer: PRIVATE HEALTH INSURANCE | Source: Ambulatory Visit | Attending: Obstetrics and Gynecology | Admitting: Obstetrics and Gynecology

## 2019-06-29 DIAGNOSIS — M8580 Other specified disorders of bone density and structure, unspecified site: Secondary | ICD-10-CM

## 2019-08-09 ENCOUNTER — Ambulatory Visit: Payer: PRIVATE HEALTH INSURANCE | Attending: Internal Medicine

## 2019-08-09 DIAGNOSIS — Z20822 Contact with and (suspected) exposure to covid-19: Secondary | ICD-10-CM

## 2019-08-10 LAB — NOVEL CORONAVIRUS, NAA: SARS-CoV-2, NAA: NOT DETECTED

## 2019-08-16 ENCOUNTER — Other Ambulatory Visit: Payer: Self-pay

## 2019-09-15 ENCOUNTER — Other Ambulatory Visit: Payer: Self-pay | Admitting: Obstetrics and Gynecology

## 2019-09-15 DIAGNOSIS — Z1231 Encounter for screening mammogram for malignant neoplasm of breast: Secondary | ICD-10-CM

## 2019-09-19 ENCOUNTER — Ambulatory Visit
Admission: RE | Admit: 2019-09-19 | Discharge: 2019-09-19 | Disposition: A | Discharge status: Home / Self Care | Payer: BC Managed Care – PPO | Source: Ambulatory Visit

## 2019-09-19 ENCOUNTER — Other Ambulatory Visit: Payer: Self-pay

## 2019-09-19 DIAGNOSIS — Z1231 Encounter for screening mammogram for malignant neoplasm of breast: Secondary | ICD-10-CM

## 2020-04-23 ENCOUNTER — Other Ambulatory Visit: Payer: Self-pay | Admitting: Endocrinology

## 2020-04-23 DIAGNOSIS — E0590 Thyrotoxicosis, unspecified without thyrotoxic crisis or storm: Secondary | ICD-10-CM

## 2020-05-27 NOTE — Progress Notes (Signed)
59 y.o. [id number] Married White or Caucasian Not Hispanic or Latino female here for annual exam.    She and her husband are separated. He is disabled. They are finalizing the divorce. Friendly.     Patient's last menstrual period was 07/27/2008.          Sexually active: No.  The current method of family planning is none and post menopausal status.    Exercising: Yes.    walking  Smoker:  no  Health Maintenance: Pap:  04/24/16 Pap and HR HPV negative 04/23/15 WNL HR HPV Neg  History of abnormal Pap:  Yes years ago  MMG:  09/19/19 density C Bi-rads 1 neg  BMD:  06/29/19 T score -2.0, FRAX 8/1% 05/26/17 DEXA T score -1.7, FRAX 6.5%/0.6%  Colonoscopy: 2015  TDaP:  2015  Gardasil: n/a She has been vaccinated for covid.    reports that she has quit smoking. She has a 5.00 pack-year smoking history. She has never used smokeless tobacco. She reports current alcohol use of about 1.0 - 2.0 standard drink of alcohol per week. She reports that she does not use drugs. Works as a Catering manager.  Daughter is 16and Son is 41.  Past Medical History:  Diagnosis Date  . Chest pressure   . Dizziness   . Hypothyroidism   . Palpitations   . Renal calculi   . Thyroid tumor, benign 2014    Past Surgical History:  Procedure Laterality Date  . BIOPSY THYROID  03/2013   --benign--Dr. Chalmers Cater  . C-section x 1      Current Outpatient Medications  Medication Sig Dispense Refill  . APPLE CIDER VINEGAR PO Take by mouth.    . calcium carbonate 200 MG capsule Take 250 mg by mouth daily.      . cholecalciferol (VITAMIN D) 1000 UNITS tablet Take 1,000 Units by mouth daily.    Marland Kitchen loratadine (CLARITIN) 10 MG tablet Take 10 mg by mouth daily.     No current facility-administered medications for this visit.    Family History  Problem Relation Age of Onset  . Osteoporosis Mother   . COPD Mother   . Bipolar disorder Sister   . Bipolar disorder Sister   . Breast cancer Neg Hx     Review of Systems   All other systems reviewed and are negative.   Exam:   BP 110/62   Pulse 60   Ht 5\' 6"  (1.676 m)   Wt 149 lb 3.2 oz (67.7 kg)   LMP 07/27/2008   SpO2 99%   BMI 24.08 kg/m   Weight change: @WEIGHTCHANGE @ Height:   Height: 5\' 6"  (167.6 cm)  Ht Readings from Last 3 Encounters:  05/29/20 5\' 6"  (1.676 m)  05/24/19 5' 6.25" (1.683 m)  05/16/18 5' 5.35" (1.66 m)    General appearance: alert, cooperative and appears stated age Head: Normocephalic, without obvious abnormality, atraumatic Neck: no adenopathy, supple, symmetrical, trachea midline and thyroid normal to inspection and palpation Lungs: clear to auscultation bilaterally Cardiovascular: regular rate and rhythm Breasts: normal appearance, no masses or tenderness Abdomen: soft, non-tender; non distended,  no masses,  no organomegaly Extremities: extremities normal, atraumatic, no cyanosis or edema Skin: Skin color, texture, turgor normal. No rashes or lesions Lymph nodes: Cervical, supraclavicular, and axillary nodes normal. No abnormal inguinal nodes palpated Neurologic: Grossly normal   Pelvic: External genitalia:  no lesions              Urethra:  normal appearing urethra with  no masses, tenderness or lesions              Bartholins and Skenes: normal                 Vagina: normal appearing vagina with normal color and discharge, no lesions              Cervix: no lesions               Bimanual Exam:  Uterus:  normal size, contour, position, consistency, mobility, non-tender              Adnexa: no mass, fullness, tenderness               Rectovaginal: Confirms               Anus:  normal sphincter tone, no lesions  Shanon Petty chaperoned for the exam.  A:  Well Woman with normal exam  Osteopenia  Vit d def  P:   DEXA in 12/22  Discussed breast self exam  Discussed calcium and vit D intake  Pap with hpv  Screening labs, vit d  Mammogram in 2/22

## 2020-05-29 ENCOUNTER — Ambulatory Visit: Payer: BC Managed Care – PPO | Admitting: Obstetrics and Gynecology

## 2020-05-29 ENCOUNTER — Other Ambulatory Visit (HOSPITAL_COMMUNITY)
Admission: RE | Admit: 2020-05-29 | Discharge: 2020-05-29 | Disposition: A | Discharge status: Home / Self Care | Payer: BC Managed Care – PPO | Source: Ambulatory Visit | Attending: Obstetrics and Gynecology | Admitting: Obstetrics and Gynecology

## 2020-05-29 ENCOUNTER — Other Ambulatory Visit: Payer: Self-pay

## 2020-05-29 ENCOUNTER — Encounter: Payer: Self-pay | Admitting: Obstetrics and Gynecology

## 2020-05-29 VITALS — BP 110/62 | HR 60 | Ht 66.0 in | Wt 149.2 lb

## 2020-05-29 DIAGNOSIS — M8580 Other specified disorders of bone density and structure, unspecified site: Secondary | ICD-10-CM

## 2020-05-29 DIAGNOSIS — Z0000 Encounter for general adult medical examination without abnormal findings: Secondary | ICD-10-CM | POA: Insufficient documentation

## 2020-05-29 DIAGNOSIS — Z01419 Encounter for gynecological examination (general) (routine) without abnormal findings: Secondary | ICD-10-CM | POA: Diagnosis not present

## 2020-05-29 DIAGNOSIS — Z124 Encounter for screening for malignant neoplasm of cervix: Secondary | ICD-10-CM

## 2020-05-29 DIAGNOSIS — E559 Vitamin D deficiency, unspecified: Secondary | ICD-10-CM

## 2020-05-29 NOTE — Patient Instructions (Addendum)
EXERCISE AND DIET:  We recommended that you start or continue a regular exercise program for good health. Regular exercise means any activity that makes your heart beat faster and makes you sweat.  We recommend exercising at least 30 minutes per day at least 3 days a week, preferably 4 or 5.  We also recommend a diet low in fat and sugar.  Inactivity, poor dietary choices and obesity can cause diabetes, heart attack, stroke, and kidney damage, among others.    ALCOHOL AND SMOKING:  Women should limit their alcohol intake to no more than 7 drinks/beers/glasses of wine (combined, not each!) per week. Moderation of alcohol intake to this level decreases your risk of breast cancer and liver damage. And of course, no recreational drugs are part of a healthy lifestyle.  And absolutely no smoking or even second hand smoke. Most people know smoking can cause heart and lung diseases, but did you know it also contributes to weakening of your bones? Aging of your skin?  Yellowing of your teeth and nails?  CALCIUM AND VITAMIN D:  Adequate intake of calcium and Vitamin D are recommended.  The recommendations for exact amounts of these supplements seem to change often, but generally speaking 1,200 mg of calcium (between diet and supplement) and 800 units of Vitamin D per day seems prudent. Certain women may benefit from higher intake of Vitamin D.  If you are among these women, your doctor will have told you during your visit.    PAP SMEARS:  Pap smears, to check for cervical cancer or precancers,  have traditionally been done yearly, although recent scientific advances have shown that most women can have pap smears less often.  However, every woman still should have a physical exam from her gynecologist every year. It will include a breast check, inspection of the vulva and vagina to check for abnormal growths or skin changes, a visual exam of the cervix, and then an exam to evaluate the size and shape of the uterus and  ovaries.  And after 57 years of age, a rectal exam is indicated to check for rectal cancers. We will also provide age appropriate advice regarding health maintenance, like when you should have certain vaccines, screening for sexually transmitted diseases, bone density testing, colonoscopy, mammograms, etc.   MAMMOGRAMS:  All women over 40 years old should have a yearly mammogram. Many facilities now offer a "3D" mammogram, which may cost around $50 extra out of pocket. If possible,  we recommend you accept the option to have the 3D mammogram performed.  It both reduces the number of women who will be called back for extra views which then turn out to be normal, and it is better than the routine mammogram at detecting truly abnormal areas.    COLON CANCER SCREENING: Now recommend starting at age 45. At this time colonoscopy is not covered for routine screening until 50. There are take home tests that can be done between 45-49.   COLONOSCOPY:  Colonoscopy to screen for colon cancer is recommended for all women at age 50.  We know, you hate the idea of the prep.  We agree, BUT, having colon cancer and not knowing it is worse!!  Colon cancer so often starts as a polyp that can be seen and removed at colonscopy, which can quite literally save your life!  And if your first colonoscopy is normal and you have no family history of colon cancer, most women don't have to have it again for   10 years.  Once every ten years, you can do something that may end up saving your life, right?  We will be happy to help you get it scheduled when you are ready.  Be sure to check your insurance coverage so you understand how much it will cost.  It may be covered as a preventative service at no cost, but you should check your particular policy.      Breast Self-Awareness Breast self-awareness means being familiar with how your breasts look and feel. It involves checking your breasts regularly and reporting any changes to your  health care provider. Practicing breast self-awareness is important. A change in your breasts can be a sign of a serious medical problem. Being familiar with how your breasts look and feel allows you to find any problems early, when treatment is more likely to be successful. All women should practice breast self-awareness, including women who have had breast implants. How to do a breast self-exam One way to learn what is normal for your breasts and whether your breasts are changing is to do a breast self-exam. To do a breast self-exam: Look for Changes  1. Remove all the clothing above your waist. 2. Stand in front of a mirror in a room with good lighting. 3. Put your hands on your hips. 4. Push your hands firmly downward. 5. Compare your breasts in the mirror. Look for differences between them (asymmetry), such as: ? Differences in shape. ? Differences in size. ? Puckers, dips, and bumps in one breast and not the other. 6. Look at each breast for changes in your skin, such as: ? Redness. ? Scaly areas. 7. Look for changes in your nipples, such as: ? Discharge. ? Bleeding. ? Dimpling. ? Redness. ? A change in position. Feel for Changes Carefully feel your breasts for lumps and changes. It is best to do this while lying on your back on the floor and again while sitting or standing in the shower or tub with soapy water on your skin. Feel each breast in the following way:  Place the arm on the side of the breast you are examining above your head.  Feel your breast with the other hand.  Start in the nipple area and make  inch (2 cm) overlapping circles to feel your breast. Use the pads of your three middle fingers to do this. Apply light pressure, then medium pressure, then firm pressure. The light pressure will allow you to feel the tissue closest to the skin. The medium pressure will allow you to feel the tissue that is a little deeper. The firm pressure will allow you to feel the tissue  close to the ribs.  Continue the overlapping circles, moving downward over the breast until you feel your ribs below your breast.  Move one finger-width toward the center of the body. Continue to use the  inch (2 cm) overlapping circles to feel your breast as you move slowly up toward your collarbone.  Continue the up and down exam using all three pressures until you reach your armpit.  Write Down What You Find  Write down what is normal for each breast and any changes that you find. Keep a written record with breast changes or normal findings for each breast. By writing this information down, you do not need to depend only on memory for size, tenderness, or location. Write down where you are in your menstrual cycle, if you are still menstruating. If you are having trouble noticing differences   in your breasts, do not get discouraged. With time you will become more familiar with the variations in your breasts and more comfortable with the exam. How often should I examine my breasts? Examine your breasts every month. If you are breastfeeding, the best time to examine your breasts is after a feeding or after using a breast pump. If you menstruate, the best time to examine your breasts is 5-7 days after your period is over. During your period, your breasts are lumpier, and it may be more difficult to notice changes. When should I see my health care provider? See your health care provider if you notice:  A change in shape or size of your breasts or nipples.  A change in the skin of your breast or nipples, such as a reddened or scaly area.  Unusual discharge from your nipples.  A lump or thick area that was not there before.  Pain in your breasts.  Anything that concerns you.  Osteopenia  Osteopenia is a loss of thickness (density) inside of the bones. Another name for osteopenia is low bone mass. Mild osteopenia is a normal part of aging. It is not a disease, and it does not cause  symptoms. However, if you have osteopenia and continue to lose bone mass, you could develop a condition that causes the bones to become thin and break more easily (osteoporosis). You may also lose some height, have back pain, and have a stooped posture. Although osteopenia is not a disease, making changes to your lifestyle and diet can help to prevent osteopenia from developing into osteoporosis. What are the causes? Osteopenia is caused by loss of calcium in the bones.  Bones are constantly changing. Old bone cells are continually being replaced with new bone cells. This process builds new bone. The mineral calcium is needed to build new bone and maintain bone density. Bone density is usually highest around age 59. After that, most people's bodies cannot replace all the bone they have lost with new bone. What increases the risk? You are more likely to develop this condition if:  You are older than age 34.  You are a woman who went through menopause early.  You have a long illness that keeps you in bed.  You do not get enough exercise.  You lack certain nutrients (malnutrition).  You have an overactive thyroid gland (hyperthyroidism).  You smoke.  You drink a lot of alcohol.  You are taking medicines that weaken the bones, such as steroids. What are the signs or symptoms? This condition does not cause any symptoms. You may have a slightly higher risk for bone breaks (fractures), so getting fractures more easily than normal may be an indication of osteopenia. How is this diagnosed? Your health care provider can diagnose this condition with a special type of X-ray exam that measures bone density (dual-energy X-ray absorptiometry, DEXA). This test can measure bone density in your hips, spine, and wrists. Osteopenia has no symptoms, so this condition is usually diagnosed after a routine bone density screening test is done for osteoporosis. This routine screening is usually done for:  Women  who are age 48 or older.  Men who are age 49 or older. If you have risk factors for osteopenia, you may have the screening test at an earlier age. How is this treated? Making dietary and lifestyle changes can lower your risk for osteoporosis. If you have severe osteopenia that is close to becoming osteoporosis, your health care provider may prescribe medicines and  dietary supplements such as calcium and vitamin D. These supplements help to rebuild bone density. Follow these instructions at home:   Take over-the-counter and prescription medicines only as told by your health care provider. These include vitamins and supplements.  Eat a diet that is high in calcium and vitamin D. ? Calcium is found in dairy products, beans, salmon, and leafy green vegetables like spinach and broccoli. ? Look for foods that have vitamin D and calcium added to them (fortified foods), such as orange juice, cereal, and bread.  Do 30 or more minutes of a weight-bearing exercise every day, such as walking, jogging, or playing a sport. These types of exercises strengthen the bones.  Take precautions at home to lower your risk of falling, such as: ? Keeping rooms well-lit and free of clutter, such as cords. ? Installing safety rails on stairs. ? Using rubber mats in the bathroom or other areas that are often wet or slippery.  Do not use any products that contain nicotine or tobacco, such as cigarettes and e-cigarettes. If you need help quitting, ask your health care provider.  Avoid alcohol or limit alcohol intake to no more than 1 drink a day for nonpregnant women and 2 drinks a day for men. One drink equals 12 oz of beer, 5 oz of wine, or 1 oz of hard liquor.  Keep all follow-up visits as told by your health care provider. This is important. Contact a health care provider if:  You have not had a bone density screening for osteoporosis and you are: ? A woman, age 46 or older. ? A man, age 75 or older.  You  are a postmenopausal woman who has not had a bone density screening for osteoporosis.  You are older than age 78 and you want to know if you should have bone density screening for osteoporosis. Summary  Osteopenia is a loss of thickness (density) inside of the bones. Another name for osteopenia is low bone mass.  Osteopenia is not a disease, but it may increase your risk for a condition that causes the bones to become thin and break more easily (osteoporosis).  You may be at risk for osteopenia if you are older than age 2 or if you are a woman who went through early menopause.  Osteopenia does not cause any symptoms, but it can be diagnosed with a bone density screening test.  Dietary and lifestyle changes are the first treatment for osteopenia. These may lower your risk for osteoporosis. This information is not intended to replace advice given to you by your health care provider. Make sure you discuss any questions you have with your health care provider. Document Revised: 06/25/2017 Document Reviewed: 04/21/2017 Elsevier Patient Education  2020 [employer].

## 2020-05-30 LAB — CBC
Hematocrit: 43.5 % (ref 34.0–46.6)
Hemoglobin: 14.5 g/dL (ref 11.1–15.9)
MCH: 31.3 pg (ref 26.6–33.0)
MCHC: 33.3 g/dL (ref 31.5–35.7)
MCV: 94 fL (ref 79–97)
Platelets: 189 10*3/uL (ref 150–450)
RBC: 4.63 x10E6/uL (ref 3.77–5.28)
RDW: 12.2 % (ref 11.7–15.4)
WBC: 4.5 10*3/uL (ref 3.4–10.8)

## 2020-05-30 LAB — CYTOLOGY - PAP
Comment: NEGATIVE
Diagnosis: NEGATIVE
High risk HPV: NEGATIVE

## 2020-05-30 LAB — LIPID PANEL
Chol/HDL Ratio: 2.8 ratio (ref 0.0–4.4)
Cholesterol, Total: 213 mg/dL — ABNORMAL HIGH (ref 100–199)
HDL: 77 mg/dL (ref >39)
LDL Chol Calc (NIH): 118 mg/dL — ABNORMAL HIGH (ref 0–99)
Triglycerides: 104 mg/dL (ref 0–149)
VLDL Cholesterol Cal: 18 mg/dL (ref 5–40)

## 2020-05-30 LAB — COMPREHENSIVE METABOLIC PANEL
ALT: 15 IU/L (ref 0–32)
AST: 18 IU/L (ref 0–40)
Albumin/Globulin Ratio: 2.5 — ABNORMAL HIGH (ref 1.2–2.2)
Albumin: 4.9 g/dL (ref 3.8–4.9)
Alkaline Phosphatase: 79 IU/L (ref 44–121)
BUN/Creatinine Ratio: 17 (ref 9–23)
BUN: 14 mg/dL (ref 6–24)
Bilirubin Total: 0.4 mg/dL (ref 0.0–1.2)
CO2: 24 mmol/L (ref 20–29)
Calcium: 9.2 mg/dL (ref 8.7–10.2)
Chloride: 102 mmol/L (ref 96–106)
Creatinine, Ser: 0.83 mg/dL (ref 0.57–1.00)
GFR calc Af Amer: 91 mL/min/{1.73_m2} (ref >59)
GFR calc non Af Amer: 79 mL/min/{1.73_m2} (ref >59)
Globulin, Total: 2 g/dL (ref 1.5–4.5)
Glucose: 95 mg/dL (ref 65–99)
Potassium: 4.2 mmol/L (ref 3.5–5.2)
Sodium: 139 mmol/L (ref 134–144)
Total Protein: 6.9 g/dL (ref 6.0–8.5)

## 2020-05-30 LAB — VITAMIN D 25 HYDROXY (VIT D DEFICIENCY, FRACTURES): Vit D, 25-Hydroxy: 63.5 ng/mL (ref 30.0–100.0)

## 2020-08-29 ENCOUNTER — Other Ambulatory Visit: Payer: Self-pay | Admitting: Obstetrics and Gynecology

## 2020-08-29 DIAGNOSIS — Z1231 Encounter for screening mammogram for malignant neoplasm of breast: Secondary | ICD-10-CM

## 2020-09-05 ENCOUNTER — Other Ambulatory Visit: Payer: Self-pay | Admitting: Obstetrics and Gynecology

## 2020-10-24 ENCOUNTER — Ambulatory Visit
Admission: RE | Admit: 2020-10-24 | Discharge: 2020-10-24 | Disposition: A | Discharge status: Home / Self Care | Payer: BC Managed Care – PPO | Source: Ambulatory Visit | Attending: Obstetrics and Gynecology | Admitting: Obstetrics and Gynecology

## 2020-10-24 ENCOUNTER — Other Ambulatory Visit: Payer: Self-pay

## 2020-10-24 DIAGNOSIS — Z1231 Encounter for screening mammogram for malignant neoplasm of breast: Secondary | ICD-10-CM

## 2021-03-17 ENCOUNTER — Ambulatory Visit
Admission: RE | Admit: 2021-03-17 | Discharge: 2021-03-17 | Disposition: A | Discharge status: Home / Self Care | Payer: BC Managed Care – PPO | Source: Ambulatory Visit | Attending: Endocrinology | Admitting: Endocrinology

## 2021-03-17 ENCOUNTER — Other Ambulatory Visit: Payer: BC Managed Care – PPO

## 2021-03-17 DIAGNOSIS — E0590 Thyrotoxicosis, unspecified without thyrotoxic crisis or storm: Secondary | ICD-10-CM

## 2021-06-04 ENCOUNTER — Other Ambulatory Visit: Payer: Self-pay

## 2021-06-04 ENCOUNTER — Encounter: Payer: Self-pay | Admitting: Obstetrics and Gynecology

## 2021-06-04 ENCOUNTER — Ambulatory Visit (INDEPENDENT_AMBULATORY_CARE_PROVIDER_SITE_OTHER): Payer: BC Managed Care – PPO | Admitting: Obstetrics and Gynecology

## 2021-06-04 VITALS — BP 122/74 | HR 57 | Ht 66.0 in | Wt 150.6 lb

## 2021-06-04 DIAGNOSIS — Z0000 Encounter for general adult medical examination without abnormal findings: Secondary | ICD-10-CM | POA: Diagnosis not present

## 2021-06-04 DIAGNOSIS — E559 Vitamin D deficiency, unspecified: Secondary | ICD-10-CM

## 2021-06-04 DIAGNOSIS — M8580 Other specified disorders of bone density and structure, unspecified site: Secondary | ICD-10-CM | POA: Diagnosis not present

## 2021-06-04 DIAGNOSIS — Z01419 Encounter for gynecological examination (general) (routine) without abnormal findings: Secondary | ICD-10-CM | POA: Diagnosis not present

## 2021-06-04 NOTE — Progress Notes (Signed)
60 y.o. [id number] Separated White or Caucasian Not Hispanic or Latino female here for annual exam.  No vaginal bleeding, not sexually active.    Having pain in her lower back and right hip, some sciatica.   Patient's last menstrual period was 07/27/2008.          Sexually active: No.  The current method of family planning is post menopausal status.    Exercising: Yes.     Walking  Smoker:  no  Health Maintenance: Pap:  05/29/20 WNL, neg HPV; 04/24/16 Pap and HR HPV negative 04/23/15 WNL HR HPV Neg  History of abnormal Pap:  yes years ago  MMG:  10/28/20 density B Bi-rads 1 neg  BMD:   06/29/19 T score -2.0, FRAX 8/1% 05/26/17 DEXA T score -1.7, FRAX 6.5%/0.6%  Colonoscopy: 2015  TDaP:  2015  Gardasil: n/a   reports that she has quit smoking. She has a 5.00 pack-year smoking history. She has never used smokeless tobacco. She reports current alcohol use of about 1.0 - 2.0 standard drink per week. She reports that she does not use drugs. Works as a Catering manager.  Daughter is 2 and Son is 47. Son in college, daughter is senior in [employer]. She and her ex are getting along okay.   Past Medical History:  Diagnosis Date   Chest pressure    Dizziness    Hypothyroidism    Palpitations    Renal calculi    Thyroid tumor, benign 2014    Past Surgical History:  Procedure Laterality Date   BIOPSY THYROID  03/2013   --benign--Dr. Chalmers Cater   C-section x 1      Current Outpatient Medications  Medication Sig Dispense Refill   APPLE CIDER VINEGAR PO Take by mouth.     calcium carbonate 200 MG capsule Take 250 mg by mouth daily.       cholecalciferol (VITAMIN D) 1000 UNITS tablet Take 1,000 Units by mouth daily.     loratadine (CLARITIN) 10 MG tablet Take 10 mg by mouth daily.     No current facility-administered medications for this visit.  Taking 2,000 IU of vit D 3 daily  Family History  Problem Relation Age of Onset   Osteoporosis Mother    COPD Mother    Bipolar disorder Sister    Bipolar  disorder Sister    Breast cancer Neg Hx     Review of Systems  All other systems reviewed and are negative.  Exam:   BP 122/74   Pulse 99   Ht 5\' 6"  (1.676 m)   Wt 150 lb 9.6 oz (68.3 kg)   LMP 07/27/2008   SpO2 (!) 57%   BMI 24.31 kg/m   Weight change: @WEIGHTCHANGE @ Height:   Height: 5\' 6"  (167.6 cm)  Ht Readings from Last 3 Encounters:  06/04/21 5\' 6"  (1.676 m)  05/29/20 5\' 6"  (1.676 m)  05/24/19 5' 6.25" (1.683 m)    General appearance: alert, cooperative and appears stated age Head: Normocephalic, without obvious abnormality, atraumatic Neck: no adenopathy, supple, symmetrical, trachea midline and thyroid normal to inspection and palpation Lungs: clear to auscultation bilaterally Cardiovascular: regular rate and rhythm Breasts: normal appearance, no masses or tenderness Abdomen: soft, non-tender; non distended,  no masses,  no organomegaly Extremities: extremities normal, atraumatic, no cyanosis or edema Skin: Skin color, texture, turgor normal. No rashes or lesions Lymph nodes: Cervical, supraclavicular, and axillary nodes normal. No abnormal inguinal nodes palpated Neurologic: Grossly normal   Pelvic: External genitalia:  no lesions              Urethra:  normal appearing urethra with no masses, tenderness or lesions              Bartholins and Skenes: normal                 Vagina: normal appearing vagina with normal color and discharge, no lesions              Cervix: no lesions               Bimanual Exam:  Uterus:  normal size, contour, position, consistency, mobility, non-tender              Adnexa: no mass, fullness, tenderness               Rectovaginal: Confirms               Anus:  normal sphincter tone, no lesions  Gae Dry chaperoned for the exam.  1. Well woman exam Discussed breast self exam Discussed calcium and vit D intake No pap this year Mammogram UTD Colonoscopy UTD  2. Osteopenia, unspecified location Getting calcium and vit d -  DG Bone Density; Future  3. Vitamin D deficiency - VITAMIN D 25 Hydroxy (Vit-D Deficiency, Fractures)  4. Laboratory exam ordered as part of routine general medical examination - CBC - Comprehensive metabolic panel - Lipid panel

## 2021-06-04 NOTE — Patient Instructions (Signed)

## 2021-06-16 ENCOUNTER — Other Ambulatory Visit: Payer: Self-pay

## 2021-06-16 ENCOUNTER — Ambulatory Visit: Payer: BC Managed Care – PPO | Admitting: Obstetrics and Gynecology

## 2021-06-16 ENCOUNTER — Encounter: Payer: Self-pay | Admitting: Obstetrics and Gynecology

## 2021-06-16 VITALS — BP 100/60 | HR 72 | Ht 66.0 in | Wt 150.0 lb

## 2021-06-16 DIAGNOSIS — N644 Mastodynia: Secondary | ICD-10-CM

## 2021-06-16 NOTE — Progress Notes (Signed)
GYNECOLOGY  VISIT   HPI: 60 y.o.   Legally Separated White or Caucasian Not Hispanic or Latino  female   [phone] with Patient's last menstrual period was 07/27/2008.   here for deep burning on the right breast on the right side. The pain started last week, she noticed it when she was carrying something under her arm with pressure against her breast.  There is an aching, burning sensation in the upper right breast. She notices the pain a couple of times a day for a couple of seconds.   MMG:  10/28/20 density B Bi-rads 1 neg   GYNECOLOGIC HISTORY: Patient's last menstrual period was 07/27/2008. Contraception:pmp  Menopausal hormone therapy: none         OB History     Gravida  4   Para  2   Term  2   Preterm  0   AB  2   Living  2      SAB  2   IAB      Ectopic      Multiple      Live Births  2              Patient Active Problem List   Diagnosis Date Noted   Unspecified hypothyroidism 04/03/2014   Chest pain 11/14/2010    Past Medical History:  Diagnosis Date   Chest pressure    Dizziness    Hypothyroidism    Palpitations    Renal calculi    Thyroid tumor, benign 2014    Past Surgical History:  Procedure Laterality Date   BIOPSY THYROID  03/2013   --benign--Dr. Chalmers Cater   C-section x 1      Current Outpatient Medications  Medication Sig Dispense Refill   APPLE CIDER VINEGAR PO Take by mouth.     calcium carbonate 200 MG capsule Take 250 mg by mouth daily.       cholecalciferol (VITAMIN D) 1000 UNITS tablet Take 1,000 Units by mouth daily.     loratadine (CLARITIN) 10 MG tablet Take 10 mg by mouth daily.     No current facility-administered medications for this visit.     ALLERGIES: Patient has no known allergies.  Family History  Problem Relation Age of Onset   Osteoporosis Mother    COPD Mother    Bipolar disorder Sister    Bipolar disorder Sister    Breast cancer Neg Hx     Social History   Socioeconomic History   Marital status:  Legally Separated    Spouse name: Not on file   Number of children: Not on file   Years of education: Not on file   Highest education level: Not on file  Occupational History   Not on file  Tobacco Use   Smoking status: Former    Packs/day: 0.25    Years: 20.00    Pack years: 5.00    Types: Cigarettes   Smokeless tobacco: Never   Tobacco comments:    smoke occasionally  Vaping Use   Vaping Use: Never used  Substance and Sexual Activity   Alcohol use: Yes    Alcohol/week: 1.0 - 2.0 standard drink    Types: 1 - 2 Standard drinks or equivalent per week   Drug use: No   Sexual activity: Not Currently    Partners: Male    Birth control/protection: Post-menopausal    Comment: husband paralyzed  Other Topics Concern   Not on file  Social History Narrative   Husband  has spinal cord injury about January 2012. He is in a wheelchair and disabled.  No bowel / bladder control.   Social Determinants of Health   Financial Resource Strain: Not on file  Food Insecurity: Not on file  Transportation Needs: Not on file  Physical Activity: Not on file  Stress: Not on file  Social Connections: Not on file  Intimate Partner Violence: Not on file    Review of Systems  All other systems reviewed and are negative.  PHYSICAL EXAMINATION:    LMP 07/27/2008     General appearance: alert, cooperative and appears stated age Breasts:  no masses, no skin changes, mildly tender in the upper, outer, right  breast.  No axillary adenopathy.  1. Mastalgia Normal exam Cut out caffeine Use ice Ibuprofen prn Make sure bra's fit well If symptoms don't improve in the next week, will set up diagnostic breast imaging.

## 2021-06-16 NOTE — Patient Instructions (Signed)
Breast Tenderness Breast tenderness is a common problem for women of all ages, but may also occur in men. Breast tenderness may range from mild discomfort to severe pain. In women, the pain usually comes and goes with the menstrual cycle, but it can also be constant. Breast tenderness has many possible causes, including hormone changes, infections, and taking certain medicines. You may have tests, such as a mammogram or an ultrasound, to check for any unusual findings. Having breast tenderness usually does not mean that you have breast cancer. Follow these instructions at home: Managing pain and discomfort  If directed, put ice to the painful area. To do this: Put ice in a plastic bag. Place a towel between your skin and the bag. Leave the ice on for 20 minutes, 2-3 times a day. Wear a supportive bra, especially during exercise. You may also want to wear a supportive bra while sleeping if your breasts are very tender. Medicines Take over-the-counter and prescription medicines only as told by your health care provider. If the cause of your pain is infection, you may be prescribed an antibiotic medicine. If you were prescribed an antibiotic, take it as told by your health care provider. Do not stop taking the antibiotic even if you start to feel better. Eating and drinking Your health care provider may recommend that you lessen the amount of fat in your diet. You can do this by: Limiting fried foods. Cooking foods using methods such as baking, boiling, grilling, and broiling. Decrease the amount of caffeine in your diet. Instead, drink more water and choose caffeine-free drinks. General instructions  Keep a log of the days and times when your breasts are most tender. Ask your health care provider how to do breast exams at home. This will help you notice if you have an unusual growth or lump. Keep all follow-up visits as told by your health care provider. This is important. Contact a health care  provider if: Any part of your breast is hard, red, and hot to the touch. This may be a sign of infection. You are a woman and: Not breastfeeding and you have fluid, especially blood or pus, coming out of your nipples. Have a new or painful lump in your breast that remains after your menstrual period ends. You have a fever. Your pain does not improve or it gets worse. Your pain is interfering with your daily activities. Summary Breast tenderness may range from mild discomfort to severe pain. Breast tenderness has many possible causes, including hormone changes, infections, and taking certain medicines. It can be treated with ice, wearing a supportive bra, and medicines. Make changes to your diet if told to by your health care provider. This information is not intended to replace advice given to you by your health care provider. Make sure you discuss any questions you have with your health care provider. Document Revised: 12/05/2018 Document Reviewed: 12/05/2018 Elsevier Patient Education  2022 [employer]  

## 2021-07-01 ENCOUNTER — Ambulatory Visit
Admission: RE | Admit: 2021-07-01 | Discharge: 2021-07-01 | Disposition: A | Discharge status: Home / Self Care | Payer: BC Managed Care – PPO | Source: Ambulatory Visit | Attending: Obstetrics and Gynecology | Admitting: Obstetrics and Gynecology

## 2021-07-01 DIAGNOSIS — M8580 Other specified disorders of bone density and structure, unspecified site: Secondary | ICD-10-CM

## 2021-07-02 ENCOUNTER — Other Ambulatory Visit: Payer: BC Managed Care – PPO

## 2021-07-19 DIAGNOSIS — E0590 Thyrotoxicosis, unspecified without thyrotoxic crisis or storm: Secondary | ICD-10-CM | POA: Insufficient documentation

## 2021-07-19 DIAGNOSIS — E049 Nontoxic goiter, unspecified: Secondary | ICD-10-CM | POA: Insufficient documentation

## 2021-09-03 ENCOUNTER — Other Ambulatory Visit: Payer: Self-pay | Admitting: Endocrinology

## 2021-09-03 DIAGNOSIS — E049 Nontoxic goiter, unspecified: Secondary | ICD-10-CM

## 2021-09-22 ENCOUNTER — Ambulatory Visit
Admission: RE | Admit: 2021-09-22 | Discharge: 2021-09-22 | Disposition: A | Discharge status: Home / Self Care | Payer: BC Managed Care – PPO | Source: Ambulatory Visit | Attending: Endocrinology | Admitting: Endocrinology

## 2021-09-22 DIAGNOSIS — E049 Nontoxic goiter, unspecified: Secondary | ICD-10-CM

## 2021-10-07 ENCOUNTER — Other Ambulatory Visit: Payer: Self-pay | Admitting: Obstetrics and Gynecology

## 2021-10-07 DIAGNOSIS — Z1231 Encounter for screening mammogram for malignant neoplasm of breast: Secondary | ICD-10-CM

## 2021-10-13 DIAGNOSIS — M79645 Pain in left finger(s): Secondary | ICD-10-CM | POA: Insufficient documentation

## 2021-10-13 HISTORY — DX: Pain in left finger(s): M79.645

## 2021-10-14 DIAGNOSIS — S62629A Displaced fracture of medial phalanx of unspecified finger, initial encounter for closed fracture: Secondary | ICD-10-CM | POA: Insufficient documentation

## 2021-10-14 HISTORY — DX: Displaced fracture of middle phalanx of unspecified finger, initial encounter for closed fracture: S62.629A

## 2021-10-27 ENCOUNTER — Ambulatory Visit
Admission: RE | Admit: 2021-10-27 | Discharge: 2021-10-27 | Disposition: A | Discharge status: Home / Self Care | Payer: BC Managed Care – PPO | Source: Ambulatory Visit | Attending: Obstetrics and Gynecology | Admitting: Obstetrics and Gynecology

## 2021-10-27 DIAGNOSIS — Z1231 Encounter for screening mammogram for malignant neoplasm of breast: Secondary | ICD-10-CM

## 2022-06-03 NOTE — Progress Notes (Signed)
61 y.o. [id number] separated White or Caucasian Not Hispanic or Latino female here for annual exam.      Patient's last menstrual period was 07/27/2008.          Sexually active: No.  The current method of family planning is post menopausal status.    Exercising: Yes.    Gym/ health club routine includes cardio and light weights. Smoker:  no  Health Maintenance: Pap:  05/29/20 WNL, neg HPV; 04/24/16 Pap and HR HPV negative 04/23/15 WNL HR HPV Neg   History of abnormal Pap:  yes years ago  MMG:  10/28/21 Bi-rads 1 neg  BMD:   07/02/21 osteopenic/low bone mass, T score -2.1, frax 16.2/2.5% Colonoscopy: 2014 TDaP:  2015  Gardasil: n/a   reports that she has quit smoking. Her smoking use included cigarettes. She has a 5.00 pack-year smoking history. She has never used smokeless tobacco. She reports current alcohol use of about 1.0 - 2.0 standard drink of alcohol per week. She reports that she does not use drugs. 1 glass of wine a day. Works as a Catering manager.  Daughter is 42 and Son is 61, both are living with her currently. Daughter at [employer], son just started working (may go to [employer]).   Past Medical History:  Diagnosis Date   Chest pressure    Dizziness    Hypothyroidism    Palpitations    Renal calculi    Thyroid tumor, benign 2014    Past Surgical History:  Procedure Laterality Date   BIOPSY THYROID  03/2013   --benign--Dr. Chalmers Cater   C-section x 1      Current Outpatient Medications  Medication Sig Dispense Refill   APPLE CIDER VINEGAR PO Take by mouth.     calcium carbonate 200 MG capsule Take 250 mg by mouth daily.       cholecalciferol (VITAMIN D) 1000 UNITS tablet Take 1,000 Units by mouth daily.     loratadine (CLARITIN) 10 MG tablet Take 10 mg by mouth daily.     No current facility-administered medications for this visit.    Family History  Problem Relation Age of Onset   Osteoporosis Mother    COPD Mother    Bipolar disorder Sister    Bipolar disorder  Sister    Breast cancer Neg Hx     Review of Systems  All other systems reviewed and are negative.   Exam:   BP 128/68   Pulse 66   Ht 5' 5.35" (1.66 m)   Wt 151 lb (68.5 kg)   LMP 07/27/2008   SpO2 100%   BMI 24.86 kg/m   Weight change: '@WEIGHTCHANGE'$ @ Height:   Height: 5' 5.35" (166 cm)  Ht Readings from Last 3 Encounters:  06/10/22 5' 5.35" (1.66 m)  06/16/21 '5\' 6"'$  (1.676 m)  06/04/21 '5\' 6"'$  (1.676 m)    General appearance: alert, cooperative and appears stated age Head: Normocephalic, without obvious abnormality, atraumatic Neck: no adenopathy, supple, symmetrical, trachea midline and thyroid normal to inspection and palpation Lungs: clear to auscultation bilaterally Cardiovascular: regular rate and rhythm Breasts: normal appearance, no masses or tenderness Abdomen: soft, non-tender; non distended,  no masses,  no organomegaly Extremities: extremities normal, atraumatic, no cyanosis or edema Skin: Skin color, texture, turgor normal. No rashes or lesions Lymph nodes: Cervical, supraclavicular, and axillary nodes normal. No abnormal inguinal nodes palpated Neurologic: Grossly normal   Pelvic: External genitalia:  no lesions  Urethra:  normal appearing urethra with no masses, tenderness or lesions              Bartholins and Skenes: normal                 Vagina: normal appearing vagina with normal color and discharge, no lesions              Cervix: no lesions               Bimanual Exam:  Uterus:  normal size, contour, position, consistency, mobility, non-tender              Adnexa: no mass, fullness, tenderness               Rectovaginal: Confirms               Anus:  normal sphincter tone, no lesions    1. Well woman exam Discussed breast self exam Discussed calcium and vit D intake Pap UTD Mammogram in 4/24 DEXA next year Colonoscopy next year Labs done this morning (fasting)

## 2022-06-09 ENCOUNTER — Ambulatory Visit: Payer: BC Managed Care – PPO | Admitting: Podiatry

## 2022-06-09 ENCOUNTER — Ambulatory Visit (INDEPENDENT_AMBULATORY_CARE_PROVIDER_SITE_OTHER): Payer: BC Managed Care – PPO

## 2022-06-09 DIAGNOSIS — M778 Other enthesopathies, not elsewhere classified: Secondary | ICD-10-CM

## 2022-06-09 DIAGNOSIS — M21622 Bunionette of left foot: Secondary | ICD-10-CM

## 2022-06-09 DIAGNOSIS — M79672 Pain in left foot: Secondary | ICD-10-CM

## 2022-06-09 MED ORDER — MELOXICAM 15 MG PO TABS: 15.0000 mg | ORAL_TABLET | Freq: Every day | ORAL | 0 refills | Status: DC | PRN

## 2022-06-09 NOTE — Patient Instructions (Signed)

## 2022-06-09 NOTE — Progress Notes (Signed)
Subjective:   Patient ID: Yvonne Strickland, female   DOB: 61 y.o.   MRN: [id number]   HPI Chief Complaint  Patient presents with   Bunions    Left foot tailor bunion pain, started 8 weeks ago after wearing a block heel shoe, patient rates the pain a 6 out of 10, throbbing sharp burning pain, sometime the pain keeps her up at night, X-rays taken today,     61 year old female presents the office with above concerns.  This started after he had an issue.  She did get rid of the shoes but the symptoms continued.  No injuries that she reports.     Review of Systems  All other systems reviewed and are negative.  Past Medical History:  Diagnosis Date   Chest pressure    Dizziness    Hypothyroidism    Palpitations    Renal calculi    Thyroid tumor, benign 2014    Past Surgical History:  Procedure Laterality Date   BIOPSY THYROID  03/2013   --benign--Dr. Chalmers Cater   C-section x 1       Current Outpatient Medications:    APPLE CIDER VINEGAR PO, Take by mouth., Disp: , Rfl:    calcium carbonate 200 MG capsule, Take 250 mg by mouth daily.  , Disp: , Rfl:    cholecalciferol (VITAMIN D) 1000 UNITS tablet, Take 1,000 Units by mouth daily., Disp: , Rfl:    loratadine (CLARITIN) 10 MG tablet, Take 10 mg by mouth daily., Disp: , Rfl:   No Known Allergies         Objective:  Physical Exam  General: AAO x3, NAD  Dermatological: Skin is warm, dry and supple bilateral.  There are no open sores, no preulcerative lesions, no rash or signs of infection present.  Vascular: Dorsalis Pedis artery and Posterior Tibial artery pedal pulses are 2/4 bilateral with immedate capillary fill time. There is no pain with calf compression, swelling, warmth, erythema.   Neruologic: Grossly intact via light touch bilateral.   Musculoskeletal: Tenderness palpation along tailor's bunion on the lateral aspect the fifth MPJ on the left foot there is localized edema present.  There is faint erythema likely more  from inflammation as opposed to infection.  There is no drainage or any open lesions.  No other areas of pinpoint tenderness.  Gait: Unassisted, Nonantalgic.       Assessment:   Left foot capsulitis, tailor's bunion     Plan:  -Treatment options discussed including all alternatives, risks, and complications -Etiology of symptoms were discussed -X-rays were obtained and reviewed with the patient.  3 views left foot were obtained.  No subacute fracture.  Tailors bunion deformity noted. -Steroid injection performed today.  Skin was cleaned with alcohol and mixture of 1 cc Kenalog 10, 0.5 cc of Marcaine plain, 0.5 cc of lidocaine plain was infiltrated into and around the fifth MPJ without complications.  Postinjection care discussed. -Dispensed offloading pad. -Shoe modifications for offloading  Return if symptoms worsen or fail to improve.  Trula Slade DPM

## 2022-06-10 ENCOUNTER — Other Ambulatory Visit: Payer: BC Managed Care – PPO

## 2022-06-10 ENCOUNTER — Ambulatory Visit (INDEPENDENT_AMBULATORY_CARE_PROVIDER_SITE_OTHER): Payer: BC Managed Care – PPO | Admitting: Obstetrics and Gynecology

## 2022-06-10 ENCOUNTER — Other Ambulatory Visit: Payer: Self-pay | Admitting: *Deleted

## 2022-06-10 ENCOUNTER — Encounter: Payer: Self-pay | Admitting: Obstetrics and Gynecology

## 2022-06-10 VITALS — BP 128/68 | HR 66 | Ht 65.35 in | Wt 151.0 lb

## 2022-06-10 DIAGNOSIS — Z01419 Encounter for gynecological examination (general) (routine) without abnormal findings: Secondary | ICD-10-CM

## 2022-06-10 LAB — COMPREHENSIVE METABOLIC PANEL
AG Ratio: 2.3 (calc) (ref 1.0–2.5)
ALT: 16 U/L (ref 6–29)
AST: 14 U/L (ref 10–35)
Albumin: 4.6 g/dL (ref 3.6–5.1)
Alkaline phosphatase (APISO): 58 U/L (ref 37–153)
BUN: 15 mg/dL (ref 7–25)
CO2: 28 mmol/L (ref 20–32)
Calcium: 9.2 mg/dL (ref 8.6–10.4)
Chloride: 103 mmol/L (ref 98–110)
Creat: 0.82 mg/dL (ref 0.50–1.03)
Globulin: 2 g/dL (calc) (ref 1.9–3.7)
Glucose, Bld: 90 mg/dL (ref 65–99)
Potassium: 4.1 mmol/L (ref 3.5–5.3)
Sodium: 138 mmol/L (ref 135–146)
Total Bilirubin: 0.5 mg/dL (ref 0.2–1.2)
Total Protein: 6.6 g/dL (ref 6.1–8.1)

## 2022-06-10 LAB — CBC
HCT: 39.6 % (ref 35.0–45.0)
Hemoglobin: 13.6 g/dL (ref 11.7–15.5)
MCH: 31.5 pg (ref 27.0–33.0)
MCHC: 34.3 g/dL (ref 32.0–36.0)
MCV: 91.7 fL (ref 80.0–100.0)
MPV: 10.4 fL (ref 7.5–12.5)
Platelets: 218 10*3/uL (ref 140–400)
RBC: 4.32 10*6/uL (ref 3.80–5.10)
RDW: 12 % (ref 11.0–15.0)
WBC: 6.4 10*3/uL (ref 3.8–10.8)

## 2022-06-10 LAB — LIPID PANEL
Cholesterol: 199 mg/dL (ref <200)
HDL: 66 mg/dL (ref >=50)
LDL Cholesterol (Calc): 112 mg/dL (calc) — ABNORMAL HIGH
Non-HDL Cholesterol (Calc): 133 mg/dL (calc) — ABNORMAL HIGH (ref <130)
Total CHOL/HDL Ratio: 3 (calc) (ref <5.0)
Triglycerides: 100 mg/dL (ref <150)

## 2022-06-10 NOTE — Patient Instructions (Signed)

## 2022-06-22 ENCOUNTER — Other Ambulatory Visit: Payer: Self-pay | Admitting: Podiatry

## 2022-06-22 DIAGNOSIS — M79672 Pain in left foot: Secondary | ICD-10-CM

## 2022-06-22 DIAGNOSIS — M21622 Bunionette of left foot: Secondary | ICD-10-CM

## 2022-06-22 DIAGNOSIS — M778 Other enthesopathies, not elsewhere classified: Secondary | ICD-10-CM

## 2022-07-02 ENCOUNTER — Encounter: Payer: Self-pay | Admitting: Family

## 2022-07-02 ENCOUNTER — Ambulatory Visit: Payer: BC Managed Care – PPO | Admitting: Family

## 2022-07-02 VITALS — BP 119/78 | HR 67 | Temp 97.8°F | Ht 66.0 in | Wt 154.0 lb

## 2022-07-02 DIAGNOSIS — Z23 Encounter for immunization: Secondary | ICD-10-CM

## 2022-07-02 DIAGNOSIS — M419 Scoliosis, unspecified: Secondary | ICD-10-CM | POA: Insufficient documentation

## 2022-07-02 DIAGNOSIS — R7989 Other specified abnormal findings of blood chemistry: Secondary | ICD-10-CM | POA: Diagnosis not present

## 2022-07-02 DIAGNOSIS — E782 Mixed hyperlipidemia: Secondary | ICD-10-CM | POA: Insufficient documentation

## 2022-07-02 NOTE — Progress Notes (Signed)
   New Patient Office Visit  Subjective:  Patient ID: Yvonne Strickland, female    DOB: 06-21-1961  Age: 61 y.o. MRN: [id number]  CC:  Chief Complaint  Patient presents with   New Patient (Initial Visit)    HPI IRAIDA CRAGIN presents for establishing care today.  Hyperlipidemia: Patient is currently maintained on the following medication for hyperlipidemia: lifestyle modifications. Patient reports good compliance with low fat/low cholesterol diet.  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     calcium carbonate 200 MG capsule Take 250 mg by mouth daily.       cholecalciferol (VITAMIN D) 1000 UNITS tablet Take 1,000 Units by mouth daily.     loratadine (CLARITIN) 10 MG tablet Take 10 mg by mouth daily.     No facility-administered medications prior to visit.   Past Medical History:  Diagnosis Date   Chest pressure    Dizziness    Fracture of middle phalanx of finger 10/14/2021   Hypothyroidism    Pain in finger of left hand 10/13/2021   Palpitations    Renal calculi    Thyroid tumor, benign 07/27/2012   Past Surgical History:  Procedure Laterality Date   BIOPSY THYROID  03/2013   --benign--Dr. Chalmers Cater   C-section x 1       Objective:   Today's Vitals: BP 119/78 (BP Location: Left Arm, Patient Position: Sitting, Cuff Size: Normal)   Pulse 67   Temp 97.8 F (36.6 C) (Temporal)   Ht '5\' 6"'$  (1.676 m)   Wt 154 lb (69.9 kg)   LMP 07/27/2008   SpO2 96%   BMI 24.86 kg/m   Physical Exam Vitals and nursing note reviewed.  Constitutional:      Appearance: Normal appearance.  Cardiovascular:     Rate and Rhythm: Normal rate and regular rhythm.  Pulmonary:     Effort: Pulmonary effort is normal.     Breath sounds: Normal breath sounds.  Musculoskeletal:        General: Normal range of motion.  Skin:    General: Skin is warm and dry.  Neurological:     Mental Status: She is alert.  Psychiatric:        Mood and Affect: Mood normal.        Behavior: Behavior normal.       Jeanie Sewer, NP

## 2022-07-02 NOTE — Assessment & Plan Note (Signed)
chronic recently checked, LDL 112, down from 118 2y ago, and total chol lowered to normal level advised on low saturated fat diet, pt is exercising as well f/u 1 year

## 2022-07-02 NOTE — Patient Instructions (Addendum)
Welcome to [facility], It was a pleasure meeting you today!   Let us know if you have any future concerns.  Hope you have a wonderful holiday!      PLEASE NOTE: If you had any LAB tests please let us know if you have not heard back within a few days. You may see your results on MyChart before we have a chance to review them but we will give you a call once they are reviewed by [place]. If we ordered any REFERRALS today, please let us know if you have not heard from their office within the next week.  Let us know through MyChart if you are needing REFILLS, or have your pharmacy send [place] the request. You can also use MyChart to communicate with me or any office staff.  Please try these tips to maintain a healthy lifestyle:    It is important that you exercise regularly at least 30 minutes 5 times a week. Think about what you will eat, plan ahead. Choose " clean, green, fresh or frozen" over canned, processed or       packaged foods which are more sugary, salty and fatty. 70 to 75% of food eaten should be vegetables and protein. Three meals at set times with snacks allowed between meals, but the must be fruit or vegetables. Aim to eat over a 10-12 hour period when you are active, for example, 7am to 5pm or 7pm, and then STOP       after your last meal of the day. Drink water every day! Shoot for 64 ounces daily = 8 cups, no other drink is as healthy. Fruit juice is best enjoyed in a healthy way, by EATING the fruit!

## 2022-12-14 ENCOUNTER — Other Ambulatory Visit: Payer: Self-pay | Admitting: Obstetrics and Gynecology

## 2022-12-14 DIAGNOSIS — Z0000 Encounter for general adult medical examination without abnormal findings: Secondary | ICD-10-CM

## 2023-01-01 ENCOUNTER — Ambulatory Visit
Admission: RE | Admit: 2023-01-01 | Discharge: 2023-01-01 | Disposition: A | Discharge status: Home / Self Care | Payer: BC Managed Care – PPO | Source: Ambulatory Visit | Attending: Obstetrics and Gynecology | Admitting: Obstetrics and Gynecology

## 2023-01-01 DIAGNOSIS — Z0000 Encounter for general adult medical examination without abnormal findings: Secondary | ICD-10-CM

## 2023-01-05 ENCOUNTER — Telehealth: Payer: Self-pay | Admitting: Obstetrics and Gynecology

## 2023-01-05 NOTE — Telephone Encounter (Signed)
Mychart message sent.

## 2023-04-30 ENCOUNTER — Other Ambulatory Visit: Payer: Self-pay | Admitting: Family

## 2023-04-30 DIAGNOSIS — Z1211 Encounter for screening for malignant neoplasm of colon: Secondary | ICD-10-CM

## 2023-04-30 DIAGNOSIS — Z1212 Encounter for screening for malignant neoplasm of rectum: Secondary | ICD-10-CM

## 2023-06-08 LAB — HM COLONOSCOPY

## 2023-06-14 ENCOUNTER — Ambulatory Visit: Payer: BC Managed Care – PPO | Admitting: Obstetrics and Gynecology

## 2023-06-30 ENCOUNTER — Encounter: Payer: Self-pay | Admitting: Obstetrics and Gynecology

## 2023-06-30 ENCOUNTER — Ambulatory Visit: Payer: BC Managed Care – PPO | Admitting: Obstetrics and Gynecology

## 2023-06-30 ENCOUNTER — Ambulatory Visit (INDEPENDENT_AMBULATORY_CARE_PROVIDER_SITE_OTHER): Payer: BC Managed Care – PPO | Admitting: Obstetrics and Gynecology

## 2023-06-30 VITALS — BP 120/78 | HR 80 | Ht 67.0 in | Wt 157.0 lb

## 2023-06-30 DIAGNOSIS — Z01419 Encounter for gynecological examination (general) (routine) without abnormal findings: Secondary | ICD-10-CM | POA: Insufficient documentation

## 2023-06-30 DIAGNOSIS — M8580 Other specified disorders of bone density and structure, unspecified site: Secondary | ICD-10-CM | POA: Diagnosis not present

## 2023-06-30 DIAGNOSIS — M85852 Other specified disorders of bone density and structure, left thigh: Secondary | ICD-10-CM | POA: Insufficient documentation

## 2023-06-30 DIAGNOSIS — Z780 Asymptomatic menopausal state: Secondary | ICD-10-CM | POA: Insufficient documentation

## 2023-06-30 HISTORY — DX: Encounter for gynecological examination (general) (routine) without abnormal findings: Z01.419

## 2023-06-30 NOTE — Assessment & Plan Note (Signed)
Continue vitamin D+Calcium Encouraged weight based exercise DXA due, ordered

## 2023-06-30 NOTE — Progress Notes (Signed)
62 y.o. [id number] postmenopausal female with osteopenia, thyroid mass (follows with endocrine) here for annual exam. 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 Doing well.  Postmenopausal bleeding: none Pelvic discharge or pain: none Breast mass, nipple discharge or skin changes : none Last PAP:     Component Value Date/Time   DIAGPAP  05/29/2020 1054    - Negative for intraepithelial lesion or malignancy (NILM)   HPVHIGH Negative 05/29/2020 1054   ADEQPAP  05/29/2020 1054    Satisfactory for evaluation; transformation zone component PRESENT.   Last mammogram: 01/01/2023 BIRADS 1, density b Last colonoscopy: 06/08/2023  Last DXA: 2022, osteopenia Sexually active: no Exercising: walking and strength training   GYN HISTORY: Osteopenia  OB History  Gravida Para Term Preterm AB Living  4 2 2  0 2 2  SAB IAB Ectopic Multiple Live Births  2       2    # Outcome Date GA Lbr Len/2nd Weight Sex Type Anes PTL Lv  4 Term 11/2003    F CS-Classical   LIV  3 Term 06/2002    M Vag-Spont   LIV  2 SAB           1 SAB             Past Medical History:  Diagnosis Date   Chest pressure    Dizziness    Fracture of middle phalanx of finger 10/14/2021   Hypothyroidism    Pain in finger of left hand 10/13/2021   Palpitations    Renal calculi    Thyroid tumor, benign 07/27/2012    Past Surgical History:  Procedure Laterality Date   BIOPSY THYROID  03/2013   --benign--Dr. Talmage Nap   C-section x 1      Current Outpatient Medications on File Prior to Visit  Medication Sig Dispense Refill   amoxicillin-clavulanate (AUGMENTIN) 875-125 MG tablet Take 1 tablet by mouth 2 (two) times daily.     APPLE CIDER VINEGAR PO Take by mouth.     Calcium-Vitamin D (CALTRATE 600 PLUS-VIT D PO)      cholecalciferol (VITAMIN D) 1000 UNITS tablet Take 1,000 Units by mouth daily.     diphenhydrAMINE HCl (ALLERGY MEDICATION PO)      loratadine (CLARITIN) 10 MG tablet Take 10 mg by mouth daily.     Multiple  Vitamin (MULTIVITAMIN ADULT PO)      No current facility-administered medications on file prior to visit.    Social History   Socioeconomic History   Marital status: Legally Separated    Spouse name: Not on file   Number of children: Not on file   Years of education: Not on file   Highest education level: Not on file  Occupational History   Not on file  Tobacco Use   Smoking status: Former    Current packs/day: 0.25    Average packs/day: 0.3 packs/day for 20.0 years (5.0 ttl pk-yrs)    Types: Cigarettes   Smokeless tobacco: Never   Tobacco comments:    smoke occasionally  Vaping Use   Vaping status: Never Used  Substance and Sexual Activity   Alcohol use: Yes    Alcohol/week: 1.0 standard drink of alcohol    Types: 1 Glasses of wine per week   Drug use: No   Sexual activity: Not Currently    Partners: Male    Birth control/protection: Post-menopausal    Comment: husband paralyzed  Other Topics Concern   Not on file  Social History Narrative  Husband has spinal cord injury about January 2012. He is in a wheelchair and disabled.  No bowel / bladder control.   Social Determinants of Health   Financial Resource Strain: Not on file  Food Insecurity: Not on file  Transportation Needs: Not on file  Physical Activity: Not on file  Stress: Not on file  Social Connections: Unknown (12/08/2021)   Received from [facility]   Social Network    Social Network: Not on file  Intimate Partner Violence: Unknown (10/30/2021)   Received from [facility]   HITS    Physically Hurt: Not on file    Insult or Talk Down To: Not on file    Threaten Physical Harm: Not on file    Scream or Curse: Not on file    Family History  Problem Relation Age of Onset   Osteoporosis Mother    COPD Mother    Bipolar disorder Sister    Bipolar disorder Sister    Breast cancer Neg Hx     No Known Allergies    PE Today's Vitals   06/30/23 1118  BP: 120/78   Pulse: 80  SpO2: 98%  Weight: 157 lb (71.2 kg)  Height: 5\' 7"  (1.702 m)   Body mass index is 24.59 kg/m.  Physical Exam Vitals reviewed. Exam conducted with a chaperone present.  Constitutional:      General: She is not in acute distress.    Appearance: Normal appearance.  HENT:     Head: Normocephalic and atraumatic.     Nose: Nose normal.  Eyes:     Extraocular Movements: Extraocular movements intact.     Conjunctiva/sclera: Conjunctivae normal.  Neck:     Thyroid: No thyroid mass, thyromegaly or thyroid tenderness.  Pulmonary:     Effort: Pulmonary effort is normal.  Chest:     Chest wall: No mass or tenderness.  Breasts:    Right: Normal. No swelling, mass, nipple discharge, skin change or tenderness.     Left: Normal. No swelling, mass, nipple discharge, skin change or tenderness.  Abdominal:     General: There is no distension.     Palpations: Abdomen is soft.     Tenderness: There is no abdominal tenderness.  Genitourinary:    General: Normal vulva.     Exam position: Lithotomy position.     Urethra: No prolapse.     Vagina: Normal. No vaginal discharge or bleeding.     Cervix: Normal. No lesion.     Uterus: Normal. Not enlarged and not tender.      Adnexa: Right adnexa normal and left adnexa normal.  Musculoskeletal:        General: Normal range of motion.     Cervical back: Normal range of motion.  Lymphadenopathy:     Upper Body:     Right upper body: No axillary adenopathy.     Left upper body: No axillary adenopathy.     Lower Body: No right inguinal adenopathy. No left inguinal adenopathy.  Skin:    General: Skin is warm and dry.  Neurological:     General: No focal deficit present.     Mental Status: She is alert.  Psychiatric:        Mood and Affect: Mood normal.        Behavior: Behavior normal.       Assessment and Plan:        Well woman exam with routine gynecological exam Assessment & Plan: Cervical  cancer screening performed  according to ASCCP guidelines. Encouraged annual mammogram screening Colonoscopy UTD DXA ordered Labs and immunizations with her primary Encouraged safe sexual practices as indicated Encouraged healthy lifestyle practices with diet and exercise For patients under 50-70yo, I recommend 1200mg  calcium daily and 600IU of vitamin D daily.    Osteopenia, unspecified location Assessment & Plan: Continue vitamin D+Calcium Encouraged weight based exercise DXA due, ordered   Orders: -     DG Bone Density; Future    Rosalyn Gess, MD

## 2023-06-30 NOTE — Patient Instructions (Signed)
For patients under 50-60yo, I recommend 1200mg  calcium daily and 600IU of vitamin D daily. For patients over 60yo, I recommend 1200mg  calcium daily and 800IU of vitamin D daily.  Health Maintenance, Female Adopting a healthy lifestyle and getting preventive care are important in promoting health and wellness. Ask your health care provider about: The right schedule for you to have regular tests and exams. Things you can do on your own to prevent diseases and keep yourself healthy. What should I know about diet, weight, and exercise? Eat a healthy diet  Eat a diet that includes plenty of vegetables, fruits, low-fat dairy products, and lean protein. Do not eat a lot of foods that are high in solid fats, added sugars, or sodium. Maintain a healthy weight Body mass index (BMI) is used to identify weight problems. It estimates body fat based on height and weight. Your health care provider can help determine your BMI and help you achieve or maintain a healthy weight. Get regular exercise Get regular exercise. This is one of the most important things you can do for your health. Most adults should: Exercise for at least 150 minutes each week. The exercise should increase your heart rate and make you sweat (moderate-intensity exercise). Do strengthening exercises at least twice a week. This is in addition to the moderate-intensity exercise. Spend less time sitting. Even light physical activity can be beneficial. Watch cholesterol and blood lipids Have your blood tested for lipids and cholesterol at 60 years of age, then have this test every 5 years. Have your cholesterol levels checked more often if: Your lipid or cholesterol levels are high. You are older than 60 years of age. You are at high risk for heart disease. What should I know about cancer screening? Depending on your health history and family history, you may need to have cancer screening at various ages. This may include screening  for: Breast cancer. Cervical cancer. Colorectal cancer. Skin cancer. Lung cancer. What should I know about heart disease, diabetes, and high blood pressure? Blood pressure and heart disease High blood pressure causes heart disease and increases the risk of stroke. This is more likely to develop in people who have high blood pressure readings or are overweight. Have your blood pressure checked: Every 3-5 years if you are 83-60 years of age. Every year if you are 4 years old or older. Diabetes Have regular diabetes screenings. This checks your fasting blood sugar level. Have the screening done: Once every three years after age 68 if you are at a normal weight and have a low risk for diabetes. More often and at a younger age if you are overweight or have a high risk for diabetes. What should I know about preventing infection? Hepatitis B If you have a higher risk for hepatitis B, you should be screened for this virus. Talk with your health care provider to find out if you are at risk for hepatitis B infection. Hepatitis C Testing is recommended for: Everyone born from [birth year] through 1963. Anyone with known risk factors for hepatitis C. Sexually transmitted infections (STIs) Get screened for STIs, including gonorrhea and chlamydia, if: You are sexually active and are younger than 60 years of age. You are older than 60 years of age and your health care provider tells you that you are at risk for this type of infection. Your sexual activity has changed since you were last screened, and you are at increased risk for chlamydia or gonorrhea. Ask your health care provider if  you are at risk. Ask your health care provider about whether you are at high risk for HIV. Your health care provider may recommend a prescription medicine to help prevent HIV infection. If you choose to take medicine to prevent HIV, you should first get tested for HIV. You should then be tested every 3 months for as long as you  are taking the medicine. Osteoporosis and menopause Osteoporosis is a disease in which the bones lose minerals and strength with aging. This can result in bone fractures. If you are 44 years old or older, or if you are at risk for osteoporosis and fractures, ask your health care provider if you should: Be screened for bone loss. Take a calcium or vitamin D supplement to lower your risk of fractures. Be given hormone replacement therapy (HRT) to treat symptoms of menopause. Follow these instructions at home: Alcohol use Do not drink alcohol if: Your health care provider tells you not to drink. You are pregnant, may be pregnant, or are planning to become pregnant. If you drink alcohol: Limit how much you have to: 0-1 drink a day. Know how much alcohol is in your drink. In the [place] one drink equals one 12 oz bottle of beer (355 mL), one 5 oz glass of wine (148 mL), or one 1 oz glass of hard liquor (44 mL). Lifestyle Do not use any products that contain nicotine or tobacco. These products include cigarettes, chewing tobacco, and vaping devices, such as e-cigarettes. If you need help quitting, ask your health care provider. Do not use street drugs. Do not share needles. Ask your health care provider for help if you need support or information about quitting drugs. General instructions Schedule regular health, dental, and eye exams. Stay current with your vaccines. Tell your health care provider if: You often feel depressed. You have ever been abused or do not feel safe at home. Summary Adopting a healthy lifestyle and getting preventive care are important in promoting health and wellness. Follow your health care provider's instructions about healthy diet, exercising, and getting tested or screened for diseases. Follow your health care provider's instructions on monitoring your cholesterol and blood pressure. This information is not intended to replace advice given to you by your health  care provider. Make sure you discuss any questions you have with your health care provider. Document Revised: 12/02/2020 Document Reviewed: 12/02/2020 Elsevier Patient Education  2024 [employer].

## 2023-06-30 NOTE — Assessment & Plan Note (Signed)
 Cervical cancer screening performed according to ASCCP guidelines. Encouraged annual mammogram screening Colonoscopy UTD DXA ordered Labs and immunizations with her primary Encouraged safe sexual practices as indicated Encouraged healthy lifestyle practices with diet and exercise For patients under 50-60yo, I recommend 1200mg  calcium daily and 600IU of vitamin D daily.

## 2023-08-02 ENCOUNTER — Other Ambulatory Visit: Payer: Self-pay | Admitting: *Deleted

## 2023-08-02 DIAGNOSIS — M8580 Other specified disorders of bone density and structure, unspecified site: Secondary | ICD-10-CM

## 2023-10-27 IMAGING — MG MM DIGITAL SCREENING BILAT W/ TOMO AND CAD
8 series · 8 of 24 positions shown · non-contrast
Comparison: Previous exam(s).

CLINICAL DATA: Screening.

EXAM:
DIGITAL SCREENING BILATERAL MAMMOGRAM WITH TOMOSYNTHESIS AND CAD
TECHNIQUE: Bilateral screening digital craniocaudal and mediolateral oblique
mammograms were obtained. Bilateral screening digital breast
tomosynthesis was performed. The images were evaluated with
computer-aided detection.

[R CC synth-2D]
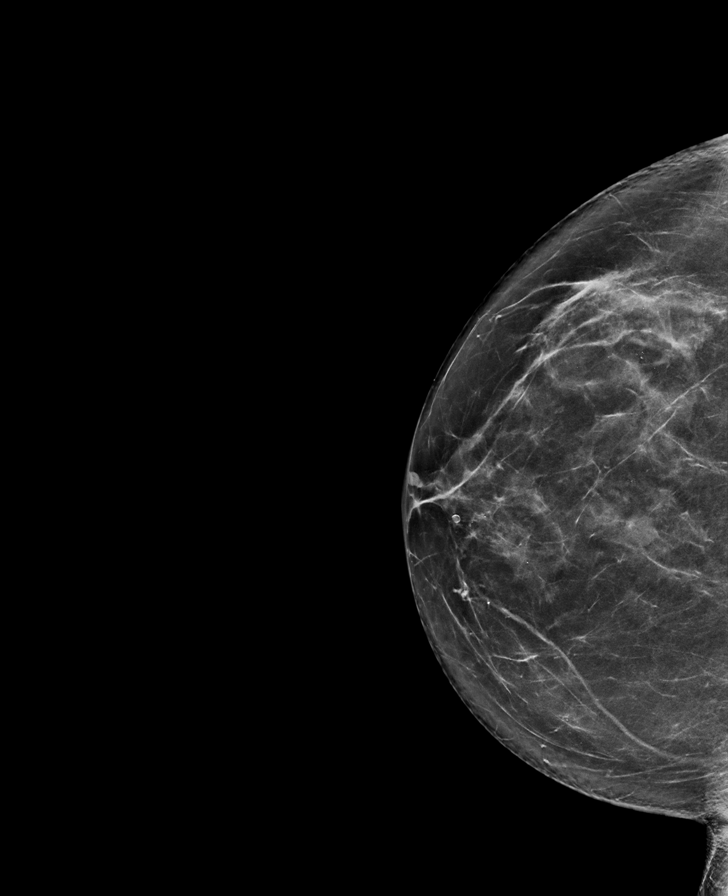

[R MLO synth-2D]
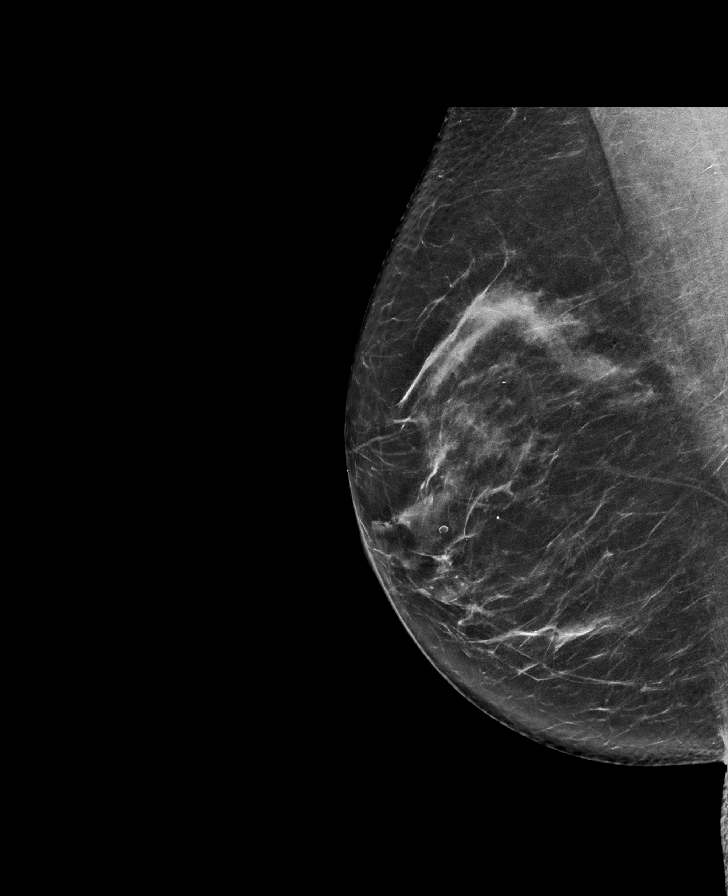

[L CC synth-2D]
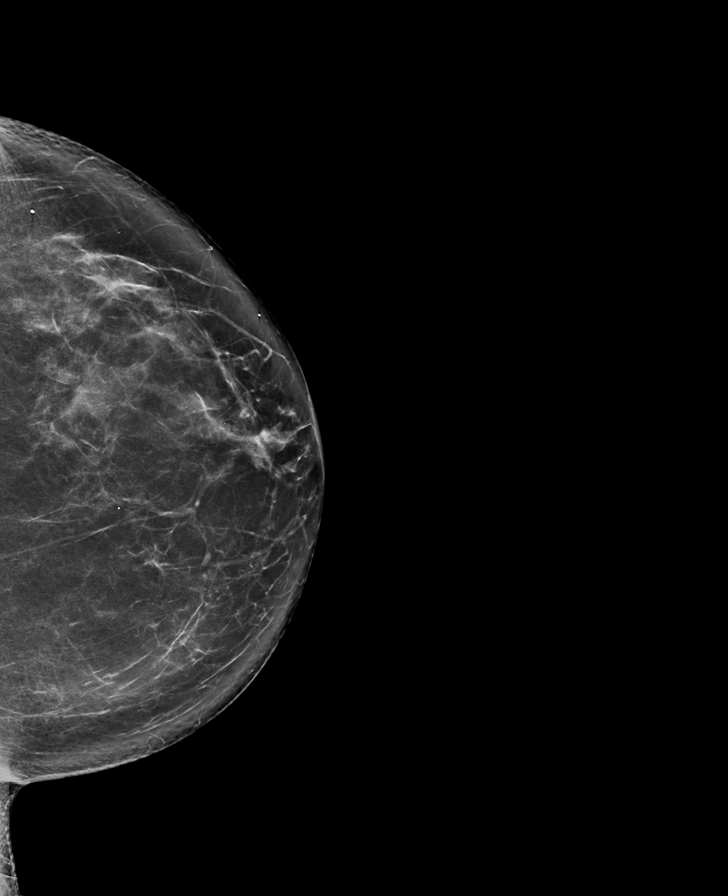

[L MLO synth-2D]
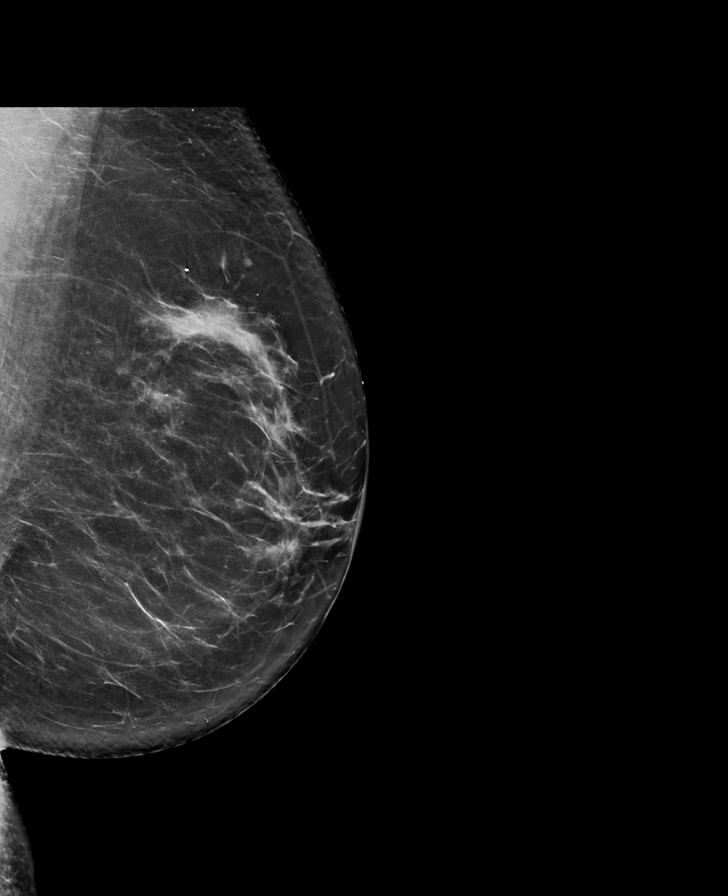

[L CC tomo · tomo slice 41/81.0]
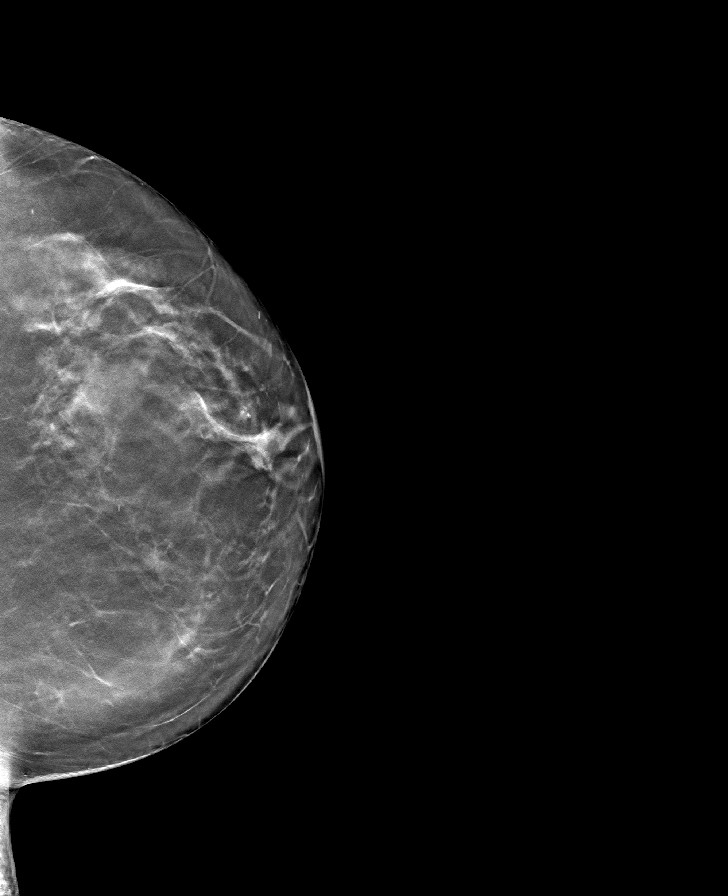

[R MLO tomo · tomo slice 41/82.0]
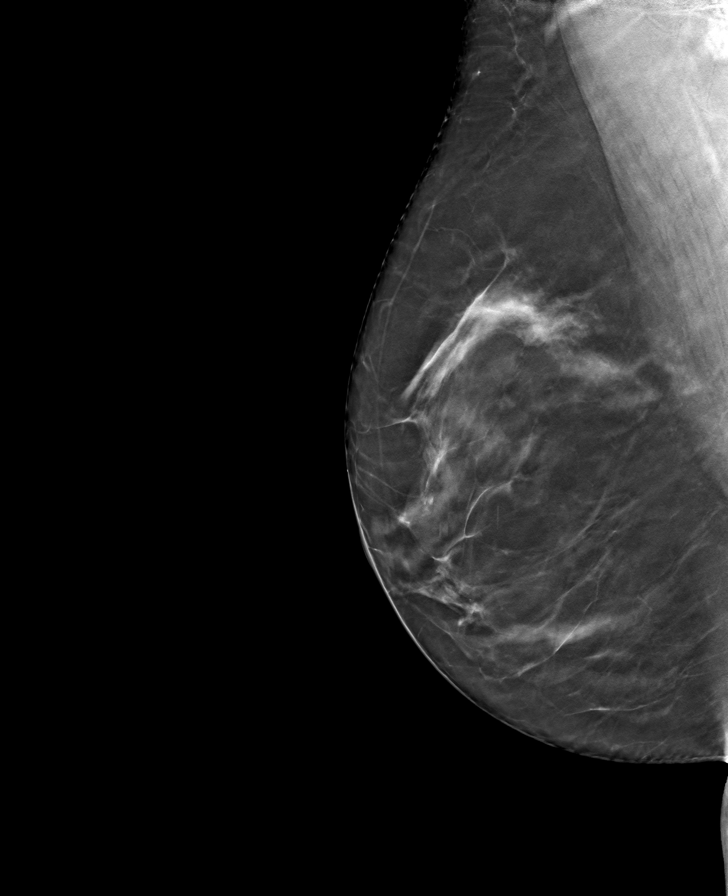

[L MLO tomo · tomo slice 42/83.0]
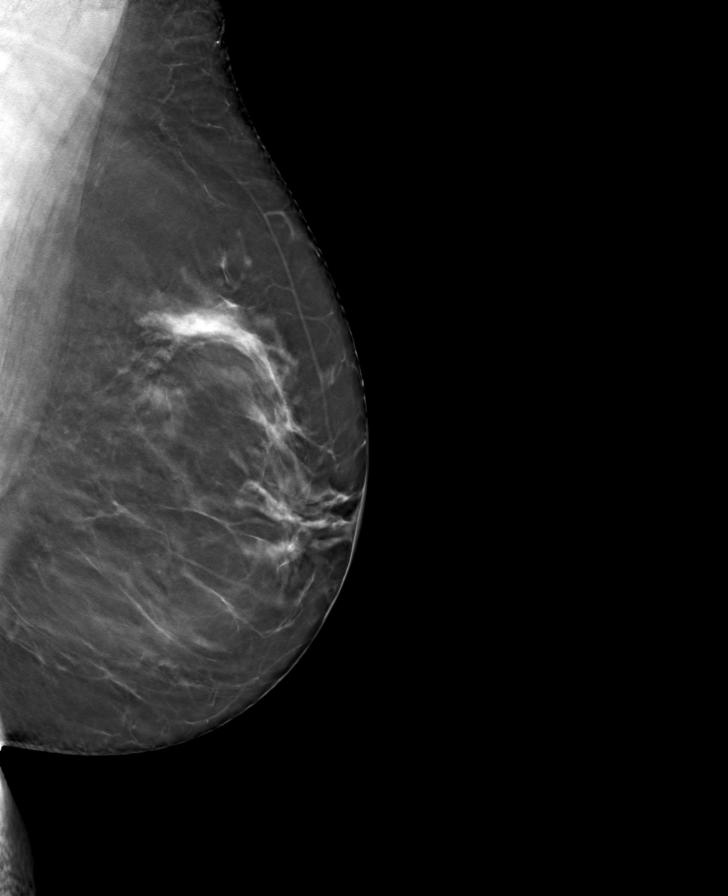

[R CC tomo · tomo slice 41/80.0]
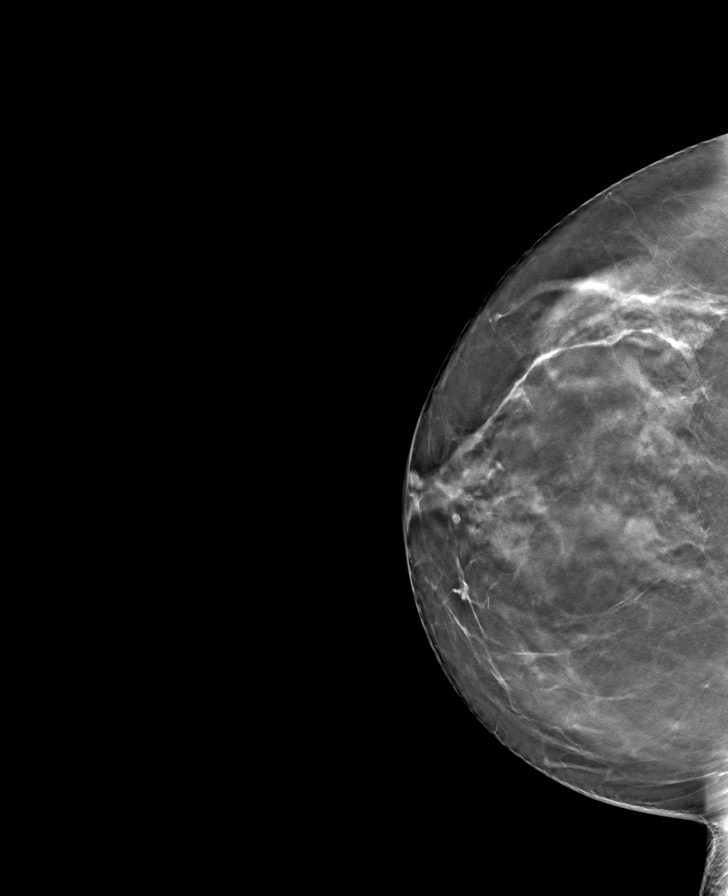

[8 of 24 positions shown; findings below may reference images not displayed]

ACR Breast Density Category b: There are scattered areas of
fibroglandular density.
FINDINGS: There are no findings suspicious for malignancy.
IMPRESSION: No mammographic evidence of malignancy. A result letter of this
screening mammogram will be mailed directly to the patient.

RECOMMENDATION:
Screening mammogram in one year. (Code:51-O-LD2)

BI-RADS CATEGORY  1: Negative.

## 2023-12-06 ENCOUNTER — Other Ambulatory Visit: Payer: Self-pay | Admitting: Family

## 2023-12-06 DIAGNOSIS — Z1231 Encounter for screening mammogram for malignant neoplasm of breast: Secondary | ICD-10-CM

## 2024-01-03 ENCOUNTER — Ambulatory Visit: Admitting: Family

## 2024-01-03 VITALS — BP 130/70 | HR 64 | Temp 98.4°F | Ht 66.0 in | Wt 158.2 lb

## 2024-01-03 DIAGNOSIS — Z0000 Encounter for general adult medical examination without abnormal findings: Secondary | ICD-10-CM | POA: Diagnosis not present

## 2024-01-03 DIAGNOSIS — Z1159 Encounter for screening for other viral diseases: Secondary | ICD-10-CM

## 2024-01-03 DIAGNOSIS — M8580 Other specified disorders of bone density and structure, unspecified site: Secondary | ICD-10-CM | POA: Diagnosis not present

## 2024-01-03 NOTE — Patient Instructions (Addendum)
 It was very nice to see you today!   I will review your lab results via MyChart in a few days.  I have sent a new order for your DEXA scan (bone density) to our radiology office. If still having trouble scheduling this let me know. I will look for your mammogram results. Let me know if the pain in your chest is persisting.  You look great! Stay well!     PLEASE NOTE:  If you had any lab tests please let us  know if you have not heard back within a few days. You may see your results on MyChart before we have a chance to review them but we will give you a call once they are reviewed by us . If we ordered any referrals today, please let us  know if you have not heard from their office within the next week.

## 2024-01-03 NOTE — Progress Notes (Signed)
 Phone [phone]  Subjective:   Patient is a 63 y.o. female presenting for annual physical.    Chief Complaint  Patient presents with   Annual Exam  HPI: Yvonne Strickland is here for a physical today. She is doing well overall. She has a complaint of pain on the right side of her chest and into her right breast, like a dull ache, that can be reproduced also when she lifts her right arm up in the air. She denies any known injury. She denies any burning or tingling and no lump felt in her breast.She denies any pain with deep breathing or chest tightness sensations, no SOB/  See problem oriented charting- ROS- full  review of systems was completed and negative except for: what is noted in HPI above.  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 Ronelle Coffee   C-section x 1      Family History  Problem Relation Age of Onset   Osteoporosis Mother    COPD Mother    Bipolar disorder Sister    Bipolar disorder Sister    Breast cancer Neg Hx     Medications- reviewed and updated Current Outpatient Medications  Medication Sig Dispense Refill   diphenhydrAMINE HCl (ALLERGY MEDICATION PO)      loratadine (CLARITIN) 10 MG tablet Take 10 mg by mouth daily.     Magnesium Oxide -Mg Supplement (MAG-200) 200 MG TABS Take 325 mg by  mouth daily. 5 a day.     Multiple Vitamin (MULTIVITAMIN ADULT PO)      cholecalciferol (VITAMIN D ) 1000 UNITS tablet Take 1,000 Units by mouth daily. (Patient not taking: Reported on 01/03/2024)     No current facility-administered medications for this visit.    Allergies-reviewed and updated No Known Allergies  Social History   Social History Narrative   Husband has spinal cord injury about January 2012. He is in a wheelchair and disabled.  No bowel / bladder control.    Objective:  BP 130/70   Pulse 64   Temp 98.4 F (36.9 C) (Temporal)   Ht 5\' 6"  (1.676 m)   Wt 158 lb 3.2 oz (71.8 kg)   LMP 07/27/2008   SpO2 99%   BMI 25.53 kg/m  Physical Exam Vitals and nursing note reviewed.  Constitutional:      Appearance: Normal appearance.  HENT:     Head: Normocephalic.     Right Ear: Tympanic membrane normal.     Left Ear: Tympanic membrane normal.     Nose: Nose normal.     Mouth/Throat:     Mouth: Mucous membranes are moist.  Eyes:     Pupils: Pupils are equal, round, and reactive to light.  Cardiovascular:     Rate and Rhythm: Normal rate and regular rhythm.  Pulmonary:  Effort: Pulmonary effort is normal.     Breath sounds: Normal breath sounds.  Musculoskeletal:        General: Normal range of motion.     Cervical back: Normal range of motion.  Lymphadenopathy:     Cervical: No cervical adenopathy.  Skin:    General: Skin is warm and dry.  Neurological:     Mental Status: She is alert.  Psychiatric:        Mood and Affect: Mood normal.        Behavior: Behavior normal.     Assessment and Plan   Health Maintenance counseling: 1. Anticipatory guidance: Patient counseled regarding regular dental exams q6 months, eye exams,  avoiding smoking and second hand smoke, limiting alcohol to 1 beverage per day, no illicit drugs.   2. Risk factor reduction:  Advised patient of need for regular exercise and diet rich with fruits and vegetables to reduce risk of heart  attack and stroke. Exercise- some walking.  Wt Readings from Last 3 Encounters:  01/03/24 158 lb 3.2 oz (71.8 kg)  06/30/23 157 lb (71.2 kg)  07/02/22 154 lb (69.9 kg)   3. Immunizations/screenings/ancillary studies Immunization History  Administered Date(s) Administered   Influenza,inj,Quad PF,6+ Mos 03/23/2017, 03/31/2019, 07/02/2022   Tdap 04/03/2014   Zoster Recombinant(Shingrix) 03/23/2017, 07/13/2017   Health Maintenance Due  Topic Date Due   HIV Screening  Never done   Hepatitis C Screening  Never done   Fecal DNA (Cologuard)  Never done    4. Cervical cancer screening-  due in 2026. 5. Breast cancer screening-  mammogram scheduled for tomorrow. 6. Colon cancer screening - had last one done this year, 2 polyps removed. F/U in 29yr. 7. Skin cancer screening- advised regular sunscreen use. Denies worrisome, changing, or new skin lesions. Has DERM appt tomorrow. 8. Birth control/STD check- N/A 9. Osteoporosis screening-  ordering today 10. Alcohol screening: 1 time per week 11. Smoking associated screening (lung cancer screening, AAA screen 65-75, UA)-  not smoking.  Assessment & Plan Intermittent Chest Pain - On right side, from sternum into right breast and up, can feel steadily for hours or Intermittently when raising her right arm above her head. Denies any recent injury. 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 No cardiac or pulmonary involvement. Further evaluation if symptoms persist. Breast mammogram scheduled for tomorrow. - Consider ultrasound if mammogram is inconclusive. - Advise use of heat, Biofreeze, or Tiger Balm for symptomatic relief. - Avoid heavy lifting or strenuous activities for next 2 weeks as able. - F/U if pain persists  Osteopenia - Osteopenia with difficulty scheduling DEXA scan at [facility], told they no longer do these. Referral to [facility] discussed. - Refer to [facility] for DEXA scan. - Will look for results and  notify via MyChart.  General Health Maintenance Up to date with screenings. DEXA scan pending. Discussed importance of hydration and regular bowel movements. - Order complete lab panel today including Hepatitis C screening and HIV. - Ensure completion of mammogram and dermatology screening. - Order  DEXA scan. - Encourage continued abstinence from smoking and mild alcohol consumption. - Advise on maintaining hydration and regular bowel movements.   Recommended follow up:  No follow-ups on file. Future Appointments  Date Time Provider Department Center  01/04/2024  7:30 AM [facility] MM 3 [facility]  04/24/2024  7:30 AM [facility] DX DEXA 1 [facility]    Lab/Order associations: fasting   Versa Gore, NP

## 2024-01-04 ENCOUNTER — Ambulatory Visit
Admission: RE | Admit: 2024-01-04 | Discharge: 2024-01-04 | Disposition: A | Discharge status: Home / Self Care | Source: Ambulatory Visit

## 2024-01-04 DIAGNOSIS — Z1231 Encounter for screening mammogram for malignant neoplasm of breast: Secondary | ICD-10-CM

## 2024-01-04 LAB — COMPREHENSIVE METABOLIC PANEL WITH GFR
AG Ratio: 2.1 (calc) (ref 1.0–2.5)
ALT: 19 U/L (ref 6–29)
AST: 20 U/L (ref 10–35)
Albumin: 4.7 g/dL (ref 3.6–5.1)
Alkaline phosphatase (APISO): 67 U/L (ref 37–153)
BUN: 13 mg/dL (ref 7–25)
CO2: 27 mmol/L (ref 20–32)
Calcium: 9.6 mg/dL (ref 8.6–10.4)
Chloride: 103 mmol/L (ref 98–110)
Creat: 0.85 mg/dL (ref 0.50–1.05)
Globulin: 2.2 g/dL (ref 1.9–3.7)
Glucose, Bld: 89 mg/dL (ref 65–99)
Potassium: 4.5 mmol/L (ref 3.5–5.3)
Sodium: 141 mmol/L (ref 135–146)
Total Bilirubin: 0.5 mg/dL (ref 0.2–1.2)
Total Protein: 6.9 g/dL (ref 6.1–8.1)
eGFR: 78 mL/min/{1.73_m2} (ref >=60)

## 2024-01-04 LAB — LIPID PANEL
Cholesterol: 249 mg/dL — ABNORMAL HIGH (ref <200)
HDL: 63 mg/dL (ref >=50)
LDL Cholesterol (Calc): 149 mg/dL — ABNORMAL HIGH
Non-HDL Cholesterol (Calc): 186 mg/dL — ABNORMAL HIGH (ref <130)
Total CHOL/HDL Ratio: 4 (calc) (ref <5.0)
Triglycerides: 220 mg/dL — ABNORMAL HIGH (ref <150)

## 2024-01-04 LAB — CBC WITH DIFFERENTIAL/PLATELET
Absolute Lymphocytes: 1439 {cells}/uL (ref 850–3900)
Absolute Monocytes: 330 {cells}/uL (ref 200–950)
Basophils Absolute: 39 {cells}/uL (ref 0–200)
Basophils Relative: 0.7 %
Eosinophils Absolute: 90 {cells}/uL (ref 15–500)
Eosinophils Relative: 1.6 %
HCT: 44.7 % (ref 35.0–45.0)
Hemoglobin: 14.6 g/dL (ref 11.7–15.5)
MCH: 31.3 pg (ref 27.0–33.0)
MCHC: 32.7 g/dL (ref 32.0–36.0)
MCV: 95.7 fL (ref 80.0–100.0)
MPV: 10.7 fL (ref 7.5–12.5)
Monocytes Relative: 5.9 %
Neutro Abs: 3702 {cells}/uL (ref 1500–7800)
Neutrophils Relative %: 66.1 %
Platelets: 196 10*3/uL (ref 140–400)
RBC: 4.67 10*6/uL (ref 3.80–5.10)
RDW: 14 % (ref 11.0–15.0)
Total Lymphocyte: 25.7 %
WBC: 5.6 10*3/uL (ref 3.8–10.8)

## 2024-01-04 LAB — TSH: TSH: 0.75 m[IU]/L (ref 0.40–4.50)

## 2024-01-04 LAB — HIV ANTIBODY (ROUTINE TESTING W REFLEX): HIV 1&2 Ab, 4th Generation: NONREACTIVE

## 2024-01-04 LAB — HEPATITIS C ANTIBODY: Hepatitis C Ab: NONREACTIVE

## 2024-01-06 ENCOUNTER — Ambulatory Visit: Payer: Self-pay | Admitting: Family

## 2024-01-06 DIAGNOSIS — M85852 Other specified disorders of bone density and structure, left thigh: Secondary | ICD-10-CM

## 2024-01-06 DIAGNOSIS — E782 Mixed hyperlipidemia: Secondary | ICD-10-CM

## 2024-01-06 DIAGNOSIS — M8588 Other specified disorders of bone density and structure, other site: Secondary | ICD-10-CM

## 2024-01-06 DIAGNOSIS — M816 Localized osteoporosis [Lequesne]: Secondary | ICD-10-CM

## 2024-01-12 MED ORDER — ROSUVASTATIN CALCIUM 5 MG PO TABS: 5.0000 mg | ORAL_TABLET | ORAL | 1 refills | Status: DC

## 2024-01-14 ENCOUNTER — Ambulatory Visit (INDEPENDENT_AMBULATORY_CARE_PROVIDER_SITE_OTHER)

## 2024-01-14 ENCOUNTER — Ambulatory Visit: Admitting: Podiatry

## 2024-01-14 DIAGNOSIS — M21622 Bunionette of left foot: Secondary | ICD-10-CM

## 2024-01-14 DIAGNOSIS — M7752 Other enthesopathy of left foot: Secondary | ICD-10-CM | POA: Diagnosis not present

## 2024-01-14 DIAGNOSIS — M778 Other enthesopathies, not elsewhere classified: Secondary | ICD-10-CM

## 2024-01-14 NOTE — Progress Notes (Signed)
 Subjective:   Patient ID: Yvonne Strickland, female   DOB: 63 y.o.   MRN: [id number]   HPI Chief Complaint  Patient presents with   Foot Pain    RM#11 Left foot pain ongoing issue not having any relief states is always on feet.     63 year old female presents the office with above concerns.  States that she is still getting pain to the left foot on the area of the tailor's bunion both laterally as well as plantarly she points to of the fifth MPJ.  She has tried changing shoes but she states that no shoes are comfortable.  She tried using offloading pads.  She is not sure if the injection was helpful or not.  No recent injury or changes.  She is a Financial controller.     Objective:  Physical Exam  General: AAO x3, NAD  Dermatological: Skin is warm, dry and supple bilateral.  There are no open sores, no preulcerative lesions, no rash or signs of infection present.  Vascular: Dorsalis Pedis artery and Posterior Tibial artery pedal pulses are 2/4 bilateral with immedate capillary fill time. There is no pain with calf compression, swelling, warmth, erythema.   Neruologic: Grossly intact via light touch bilateral.   Musculoskeletal: Tenderness palpation along tailor's bunion on the lateral and plantar aspect the fifth MPJ on the left foot there is localized edema present.  There is faint erythema likely more from inflammation as opposed to infection.  There is no drainage or any open lesions.  No other areas of pinpoint tenderness.  Gait: Unassisted, Nonantalgic.       Assessment:   Left foot capsulitis, tailor's bunion     Plan:  -Treatment options discussed including all alternatives, risks, and complications -Etiology of symptoms were discussed -X-rays were obtained and reviewed with the patient.  3 views left foot were obtained.  No subacute fracture.  Tailors bunion deformity present. -We discussed both conservative as well as surgical options.  Conservatively we discussed  another injection, offloading pads, shoe modifications.  Surgically we discussed tailor's bunionectomy including fifth metatarsal osteotomy.  After discussion she is likely going to proceed with surgery.  She will talk to work and discuss with HR next week and if she wants to proceed she will let me know.  We will work on Museum/gallery curator card for her as well for her left foot tailor's bunionectomy.  No follow-ups on file.  Donnice JONELLE Fees DPM

## 2024-01-17 ENCOUNTER — Encounter: Payer: Self-pay | Admitting: Podiatry

## 2024-01-19 NOTE — Telephone Encounter (Signed)
 Yvonne Strickland, can you please assist her in getting a date? She will need to come in for a surgery paperwork but it will be for a Tailors Bunionectomy [phone]). Thanks!

## 2024-01-24 ENCOUNTER — Telehealth: Payer: Self-pay | Admitting: Podiatry

## 2024-01-24 NOTE — Telephone Encounter (Signed)
 Left message for pt that she will need to come in for a surgical consult before her surgery. I asked her to call me to get it scheduled.

## 2024-01-24 NOTE — Telephone Encounter (Signed)
 Pt called and I have her scheduled for surgery on 8/6. Did she need to come in for a surgical consult as I did not have a consent and she did not get a surgical bag.   I told pt I would put a bag up front for her to pick up when convenient for her

## 2024-01-26 NOTE — Telephone Encounter (Signed)
 Pt scheduled to see Dr Gershon on 7/22 to sign consent forms for surgery on 8/6

## 2024-02-02 ENCOUNTER — Ambulatory Visit
Admission: RE | Admit: 2024-02-02 | Discharge: 2024-02-02 | Disposition: A | Discharge status: Home / Self Care | Source: Ambulatory Visit | Attending: Family | Admitting: Family

## 2024-02-02 ENCOUNTER — Encounter: Payer: Self-pay | Admitting: Podiatry

## 2024-02-02 DIAGNOSIS — M8580 Other specified disorders of bone density and structure, unspecified site: Secondary | ICD-10-CM | POA: Diagnosis not present

## 2024-02-02 DIAGNOSIS — Z0271 Encounter for disability determination: Secondary | ICD-10-CM

## 2024-02-02 NOTE — Telephone Encounter (Signed)
 s/w pt AA forms recd DOS 03/01/24 last day of work 02/29/24 RTW 04/26/24 approx. Will do letter that she needs to wear tennis shoes 02/03/24-07/26/24. faxing [phone] and email pt-email on file

## 2024-02-07 DIAGNOSIS — M816 Localized osteoporosis [Lequesne]: Secondary | ICD-10-CM | POA: Insufficient documentation

## 2024-02-08 ENCOUNTER — Ambulatory Visit: Admitting: Podiatry

## 2024-02-15 ENCOUNTER — Ambulatory Visit (INDEPENDENT_AMBULATORY_CARE_PROVIDER_SITE_OTHER): Admitting: Podiatry

## 2024-02-15 DIAGNOSIS — M21622 Bunionette of left foot: Secondary | ICD-10-CM

## 2024-02-15 NOTE — Progress Notes (Signed)
 Subjective:   Patient ID: Yvonne Strickland, female   DOB: 63 y.o.   MRN: [id number]   HPI No chief complaint on file.    63 year old female presents the office with above concerns.  She presents today for surgical consultation given ongoing pain along the tailor's bunion.  She is tried shoe modifications, offloading padding without significant improvement.  This time she wants to proceed with surgery given ongoing pain.   She is a Financial controller.     Objective:  Physical Exam  General: AAO x3, NAD  Dermatological: Skin is warm, dry and supple bilateral.  There are no open sores, no preulcerative lesions, no rash or signs of infection present.  Vascular: Dorsalis Pedis artery and Posterior Tibial artery pedal pulses are 2/4 bilateral with immedate capillary fill time. There is no pain with calf compression, swelling, warmth, erythema.   Neruologic: Grossly intact via light touch bilateral.   Musculoskeletal: Tenderness palpation along tailor's bunion on the lateral and plantar aspect the fifth MPJ on the left foot there is localized edema present.  There is faint erythema likely more from inflammation as opposed to infection.  There is no drainage or any open lesions.  No other areas of pinpoint tenderness.  Exam unchanged.  Gait: Unassisted, Nonantalgic.       Assessment:   Left foot capsulitis, tailor's bunion     Plan:  -Treatment options discussed including all alternatives, risks, and complications -Etiology of symptoms were discussed -We reviewed the x-rays.  We discussed both conservative as well as surgical treatment options.  At this time she wants to proceed with surgery.  Discussed with her left fifth metatarsal osteotomy for tailor's bunionectomy.  She wishes to proceed. -The incision placement as well as the postoperative course was discussed with the patient. I discussed risks of the surgery which include, but not limited to, infection, bleeding, pain, swelling,  need for further surgery, delayed or nonhealing, painful or ugly scar, numbness or sensation changes, over/under correction, recurrence, transfer lesions, further deformity, hardware failure, DVT/PE, loss of toe/foot. Patient understands these risks and wishes to proceed with surgery. The surgical consent was reviewed with the patient all 3 pages were signed. No promises or guarantees were given to the outcome of the procedure. All questions were answered to the best of my ability. Before the surgery the patient was encouraged to call the office if there is any further questions. The surgery will be performed at the [facility] on an outpatient basis. - She was to hold off on any narcotics postoperatively.  Discussed anti-inflammatories, gabapentin.  Donnice JONELLE Fees DPM

## 2024-02-15 NOTE — Patient Instructions (Signed)

## 2024-02-23 ENCOUNTER — Telehealth: Payer: Self-pay | Admitting: Podiatry

## 2024-02-23 NOTE — Telephone Encounter (Signed)
 DOS- 03/01/2024  TAILOR BUNION OSTEOTOMY-LT- 71691  DEDUCTIBLE- $400 FAMILY DEDUCTIBLE- $1200 OOP- $2400 FAMILY OOP- $6200  PER [employer] AUTOMATED SYSTEM, NO PRIOR AUTH REQUIRED FOR CPT CODE 71691. REF# [id number] 02/23/2024

## 2024-02-25 HISTORY — PX: FOOT SURGERY: SHX648

## 2024-03-01 ENCOUNTER — Telehealth: Payer: Self-pay | Admitting: Podiatry

## 2024-03-01 ENCOUNTER — Other Ambulatory Visit: Payer: Self-pay | Admitting: Podiatry

## 2024-03-01 DIAGNOSIS — M2012 Hallux valgus (acquired), left foot: Secondary | ICD-10-CM | POA: Diagnosis not present

## 2024-03-01 MED ORDER — CEPHALEXIN 500 MG PO CAPS: 500.0000 mg | ORAL_CAPSULE | Freq: Three times a day (TID) | ORAL | 0 refills | Status: DC

## 2024-03-01 MED ORDER — OXYCODONE-ACETAMINOPHEN 5-325 MG PO TABS
1.0000 | ORAL_TABLET | ORAL | 0 refills | Status: DC | PRN
Start: 2024-03-01 — End: 2024-03-30

## 2024-03-01 MED ORDER — IBUPROFEN 800 MG PO TABS: 800.0000 mg | ORAL_TABLET | Freq: Three times a day (TID) | ORAL | 0 refills | Status: DC | PRN

## 2024-03-01 MED ORDER — GABAPENTIN 100 MG PO CAPS: 100.0000 mg | ORAL_CAPSULE | Freq: Three times a day (TID) | ORAL | 0 refills | Status: DC

## 2024-03-01 MED ORDER — PROMETHAZINE HCL 25 MG PO TABS: 25.0000 mg | ORAL_TABLET | Freq: Three times a day (TID) | ORAL | 0 refills | Status: DC | PRN

## 2024-03-01 NOTE — Telephone Encounter (Signed)
 Thanks spoke to patient is aware of new medication.

## 2024-03-01 NOTE — Telephone Encounter (Signed)
 Patient requesting script for alternative pain medication. She doesn't want to take oxycodone .

## 2024-03-06 ENCOUNTER — Ambulatory Visit (INDEPENDENT_AMBULATORY_CARE_PROVIDER_SITE_OTHER)

## 2024-03-06 ENCOUNTER — Ambulatory Visit (INDEPENDENT_AMBULATORY_CARE_PROVIDER_SITE_OTHER): Admitting: Podiatry

## 2024-03-06 VITALS — HR 63

## 2024-03-06 DIAGNOSIS — M21622 Bunionette of left foot: Secondary | ICD-10-CM

## 2024-03-06 NOTE — Progress Notes (Signed)
 Subjective: Chief Complaint  Patient presents with   Routine Post Op    POV # 1 DOS 03/01/24 LEFT FOOT CORRECTION OF TAILORS BUNION W/ SCREW FIXATION Denies any fever chills nausea or vomiting Pain is manageable     63 year old female presents the office with above concerns.  She states she been doing well.  She has not been taking anything for pain currently as her pain is controlled.  Does not report any fevers or chills.  No other concerns.  Objective: AAO x3, NAD DP/PT pulses palpable bilaterally, CRT less than 3 seconds Left foot: Incision well coapted with sutures intact.  There is mild surrounding edema there is no erythema, drainage or pus or any signs of infection.  Minimal discomfort in surgical site.  No signs of infection. No pain with calf compression, swelling, warmth, erythema  Assessment: Status post left tailor's bunionectomy  Plan: -All treatment options discussed with the patient including all alternatives, risks, complications.  -X-rays obtained reviewed.  Hardware intact but any complicating factors status post fifth metatarsal osteotomy. -Incisions healing well.  Antibiotic ointment was applied followed by dressing.  She keep the dressing clean, dry, intact until follow-up -Remain in cam boot, weight-bear as tolerated although limited.  Ice, elevation. -Patient encouraged to call the office with any questions, concerns, change in symptoms.   Yvonne Strickland DPM

## 2024-03-16 ENCOUNTER — Encounter: Payer: Self-pay | Admitting: Podiatry

## 2024-03-16 ENCOUNTER — Ambulatory Visit (INDEPENDENT_AMBULATORY_CARE_PROVIDER_SITE_OTHER): Admitting: Podiatry

## 2024-03-16 DIAGNOSIS — M21622 Bunionette of left foot: Secondary | ICD-10-CM

## 2024-03-16 NOTE — Progress Notes (Signed)
 Subjective: Chief Complaint  Patient presents with   Routine Post Op    POV # 2 DOS 03/01/24 LEFT FOOT CORRECTION OF TAILORS BUNION W/ SCREW FIXATION. 1 pain. Wearing pneumatic cast. Non diabetic.    63 year old female presents the office with above concerns.  She states she been doing well.  She states that she is not taking any pain medication.  The swelling is improved.  Does not report any fevers or chills.  No other concerns.   Objective: AAO x3, NAD DP/PT pulses palpable bilaterally, CRT less than 3 seconds Left foot: Incision well coapted with sutures intact.  There is decreased edema there is no surrounding erythema, drainage or pus or any signs of infection noted today.  No pain on exam. No pain with calf compression, swelling, warmth, erythema  Assessment: Status post left tailor's bunionectomy  Plan: -All treatment options discussed with the patient including all alternatives, risks, complications.  -Discussed that she can wash with soap and water, dry thoroughly.  Small amount of antibiotic ointment followed by dressing was also applied today and can continue with this at home. -Continue ice, elevate as well as compression of the residual edema -Remain in cam boot limited weightbearing. -Monitor for any clinical signs or symptoms of infection and directed to call the office immediately should any occur or go to the ER.  Return for post-op as scheduled; x-ray.  Yvonne Strickland DPM

## 2024-03-30 ENCOUNTER — Ambulatory Visit (INDEPENDENT_AMBULATORY_CARE_PROVIDER_SITE_OTHER)

## 2024-03-30 ENCOUNTER — Encounter: Payer: Self-pay | Admitting: Podiatry

## 2024-03-30 ENCOUNTER — Ambulatory Visit (INDEPENDENT_AMBULATORY_CARE_PROVIDER_SITE_OTHER): Admitting: Podiatry

## 2024-03-30 ENCOUNTER — Encounter: Payer: Self-pay | Admitting: Family

## 2024-03-30 VITALS — Ht 66.0 in | Wt 158.2 lb

## 2024-03-30 DIAGNOSIS — M21622 Bunionette of left foot: Secondary | ICD-10-CM

## 2024-03-30 NOTE — Progress Notes (Signed)
 Subjective: Chief Complaint  Patient presents with   Routine Post Op     POV # 3 DOS 03/01/24 LEFT FOOT CORRECTION OF TAILORS BUNION W/ SCREW FIXATION, pt states everything is going well, still some on and off pain at random times, continues to wear cam boot to ambulate no other complaints.      63 year old female presents the office with above concerns.  States that she has been doing well.  She has been wearing the cam boot.  She said that she actually did 10,000 steps last weekend did not have any increased pain.  Does not take any medication at this time.  Does not report any fevers or chills.  No other concerns today.    Objective: AAO x3, NAD DP/PT pulses palpable bilaterally, CRT less than 3 seconds Left foot: Incision well coapted. There is decreased edema there is no surrounding erythema, drainage or pus or any signs of infection noted today.  There is no tenderness on exam today. No pain with calf compression, swelling, warmth, erythema  Assessment: Status post left tailor's bunionectomy  Plan: -All treatment options discussed with the patient including all alternatives, risks, complications.  -X-rays obtained reviewed.  Multiple views obtained.  Hardware intact with uncomplicated factors.  Status post tailor's bunionectomy with fifth metatarsal osteotomy. -Discussed continue cam boot for the next 2 weeks.  Ice, elevate as well as compression of the residual edema. -Pain medication as needed.  Return in about 2 weeks (around 04/13/2024) for post-op, x-ray.  At that time she can gradually transition to a regular shoe.  She is scheduled back to work October 1 which think is reasonable as long as she is doing well next appointment.  Donnice JONELLE Fees DPM

## 2024-03-30 NOTE — Telephone Encounter (Signed)
 She can schedule OV to discuss, depends how much/long smoking as to which test and we can check her lipids again then also, does not need to be fasting, thx.

## 2024-03-30 NOTE — Telephone Encounter (Signed)
 Can you schedule her a 2 week follow up? She can see another provider while I am out. Thanks!

## 2024-03-30 NOTE — Telephone Encounter (Signed)
 Patient has been scheduled

## 2024-03-31 ENCOUNTER — Encounter: Payer: Self-pay | Admitting: Family

## 2024-03-31 ENCOUNTER — Ambulatory Visit

## 2024-03-31 ENCOUNTER — Ambulatory Visit: Admitting: Family

## 2024-03-31 VITALS — BP 131/78 | HR 64 | Temp 98.1°F | Ht 66.0 in | Wt 157.0 lb

## 2024-03-31 DIAGNOSIS — R079 Chest pain, unspecified: Secondary | ICD-10-CM

## 2024-03-31 DIAGNOSIS — R001 Bradycardia, unspecified: Secondary | ICD-10-CM | POA: Diagnosis not present

## 2024-03-31 DIAGNOSIS — E782 Mixed hyperlipidemia: Secondary | ICD-10-CM

## 2024-03-31 LAB — COMPREHENSIVE METABOLIC PANEL WITH GFR
ALT: 23 U/L (ref 0–35)
AST: 20 U/L (ref 0–37)
Albumin: 4.7 g/dL (ref 3.5–5.2)
Alkaline Phosphatase: 62 U/L (ref 39–117)
BUN: 14 mg/dL (ref 6–23)
CO2: 29 meq/L (ref 19–32)
Calcium: 9.6 mg/dL (ref 8.4–10.5)
Chloride: 102 meq/L (ref 96–112)
Creatinine, Ser: 0.78 mg/dL (ref 0.40–1.20)
GFR: 82.26 mL/min (ref >60.00)
Glucose, Bld: 104 mg/dL — ABNORMAL HIGH (ref 70–99)
Potassium: 4.4 meq/L (ref 3.5–5.1)
Sodium: 140 meq/L (ref 135–145)
Total Bilirubin: 0.5 mg/dL (ref 0.2–1.2)
Total Protein: 6.9 g/dL (ref 6.0–8.3)

## 2024-03-31 LAB — LIPID PANEL
Cholesterol: 167 mg/dL (ref 0–200)
HDL: 66.5 mg/dL (ref >39.00)
LDL Cholesterol: 72 mg/dL (ref 0–99)
NonHDL: 100.04
Total CHOL/HDL Ratio: 3
Triglycerides: 141 mg/dL (ref 0.0–149.0)
VLDL: 28.2 mg/dL (ref 0.0–40.0)

## 2024-03-31 NOTE — Progress Notes (Signed)
 Patient ID: Yvonne Strickland, female    DOB: 1961/03/05, 63 y.o.   MRN: [id number]  Chief Complaint  Patient presents with   Breast Pain    Pt c/o right breast pain, present since June.    Hyperlipidemia  Discussed the use of AI scribe software for clinical note transcription with the patient, who gave verbal consent to proceed.  History of Present Illness   Yvonne Strickland is a 63 year old female who presents with concerns about chest discomfort and heart rate variability.  Chest discomfort - Intermittent chest discomfort described as a 'weird' sensation along the right breastbone and in the right quadrant of upper chest - Occasionally feels like pressure on a nerve - Discomfort was more noticeable last night but is not sore today  Heart rate variability and associated symptoms - Heart rate variability ranging from 49 to 104 beats per minute - Lowest recorded heart rate of 42 bpm during preoperative monitoring, which caused concern - Lightheadedness, particularly after exercise - Attributes some lightheadedness to dehydration, and having traveled   Tobacco and environmental exposure history - History of smoking during high school - Exposure to secondhand smoke from her mother - Sister reminded her about potential need for lung scan due to maternal smoking history - Exposure to toxic fumes at work, as well as smoke, resulting in symptoms similar to intoxication - 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 No signs of cardiac or significant respiratory issues. Exposure to secondhand smoke and aerotoxic syndrome noted. - Order chest X-ray to rule out respiratory issues. - Perform laboratory tests to assess overall health status.  Postoperative care following left foot bunionette surgery Healing well post-surgery with no pain. Advised to avoid overexertion. Boot to be worn for two more weeks.  Bradycardia Reports having very low readings prior to her recent surgery and her smart apple watch she has a lot of variability, with average HR between 50-55, with a few readings below 50. Denies any symptoms. Reassured normal finding, but advised on symptoms to watch for. - Will continue to monitor, pt aware of when to notify office.  Mixed hyperlipidemia Mixed hyperlipidemia managed with rosuvastatin . Diet modifications in place. Monitoring cholesterol levels for management efficacy. Started Crestor  and tolerating. - Recheck lipid panel today. - Continue rosuvastatin  (Crestor ) as prescribed. - Monitor cholesterol levels in 1 year  Follow-Up Follow-up for results of chest X-ray and laboratory tests.     Subjective:    Outpatient Medications Prior to Visit  Medication Sig Dispense Refill   cholecalciferol (VITAMIN D ) 1000 UNITS tablet Take 1,000 Units by mouth daily.     diphenhydrAMINE HCl (ALLERGY MEDICATION PO)      ibuprofen  (ADVIL ) 800 MG tablet Take 1 tablet (800 mg total) by mouth every 8 (eight) hours as needed. 30 tablet 0   loratadine (CLARITIN) 10 MG tablet Take 10 mg by mouth daily.     Magnesium Oxide -Mg Supplement (MAG-200) 200 MG TABS Take 325 mg by mouth daily.  5 a day.     Multiple Vitamin (MULTIVITAMIN ADULT PO)      rosuvastatin  (CRESTOR ) 5 MG tablet Take 1 tablet (5 mg total) by mouth as directed. Start with 1 pill on Monday,Wed, Fri. for 2 weeks, then increase to 1 pill qod x 2w, then 1 pill  qd 90 tablet 1   cephALEXin  (KEFLEX ) 500 MG capsule Take 1 capsule (500 mg total) by mouth 3 (three) times daily. 9 capsule 0   gabapentin  (NEURONTIN ) 100 MG capsule Take 1 capsule (100 mg total) by mouth 3 (three) times daily. 30 capsule 0   No facility-administered medications prior to visit.   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 Tommas   BREAST CYST ASPIRATION Right    C-section x 1     No Known Allergies    Objective:    Physical Exam Vitals and nursing note reviewed.  Constitutional:      Appearance: Normal appearance.  Cardiovascular:     Rate and Rhythm: Normal rate and regular rhythm.  Pulmonary:     Effort: Pulmonary effort is normal.     Breath sounds: Normal breath sounds.  Musculoskeletal:        General: Normal range of motion.  Feet:     Comments: pt wearing boot post left bunionectomy surgery Skin:    General: Skin is warm and dry.  Neurological:     Mental Status: She is alert.  Psychiatric:        Mood and Affect: Mood normal.        Behavior: Behavior normal.    BP 131/78 (BP Location: Left Arm, Patient Position: Sitting, Cuff Size: Large)   Pulse 64   Temp 98.1 F (36.7 C) (Temporal)   Ht 5' 6 (1.676 m)   Wt 157 lb (71.2 kg)   LMP 07/27/2008   SpO2 95%   BMI 25.34 kg/m  Wt Readings from Last 3 Encounters:  03/31/24 157 lb (71.2 kg)  03/30/24 158 lb 3.2 oz (71.8 kg)  01/03/24 158 lb 3.2 oz (71.8 kg)     Lucius Krabbe, NP

## 2024-03-31 NOTE — Assessment & Plan Note (Signed)
 Reports having very low readings prior to her recent surgery and her smart apple watch she has a lot of variability, with average HR between 50-55, with a few readings below 50. Denies any symptoms. Reassured normal finding, but advised on symptoms to watch for. - Will continue to monitor, pt aware of when to notify office.

## 2024-03-31 NOTE — Assessment & Plan Note (Signed)
 Mixed hyperlipidemia managed with rosuvastatin . Diet modifications in place. Monitoring cholesterol levels for management efficacy. Started Crestor  and tolerating. - Recheck lipid panel today. - Continue rosuvastatin  (Crestor ) as prescribed. - Monitor cholesterol levels in 1 year

## 2024-04-03 ENCOUNTER — Encounter: Payer: Self-pay | Admitting: Podiatry

## 2024-04-03 ENCOUNTER — Ambulatory Visit: Payer: Self-pay | Admitting: Family

## 2024-04-05 ENCOUNTER — Telehealth: Payer: Self-pay | Admitting: Radiology

## 2024-04-05 NOTE — Telephone Encounter (Signed)
 CXR marked for review today.

## 2024-04-12 ENCOUNTER — Ambulatory Visit (INDEPENDENT_AMBULATORY_CARE_PROVIDER_SITE_OTHER)

## 2024-04-12 ENCOUNTER — Ambulatory Visit (INDEPENDENT_AMBULATORY_CARE_PROVIDER_SITE_OTHER): Admitting: Podiatry

## 2024-04-12 DIAGNOSIS — M21622 Bunionette of left foot: Secondary | ICD-10-CM

## 2024-04-12 NOTE — Progress Notes (Signed)
 Subjective:   Patient ID: Yvonne Strickland, female   DOB: 63 y.o.   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  I gave instructions on elevation as needed anti-inflammatories as needed and patient will be seen back to recheck as needed.  X-rays indicate osteotomies healing very well alignment is good screw is intact no indication secondary to motion and should be able to return to work in mid October

## 2024-04-13 ENCOUNTER — Encounter: Payer: Self-pay | Admitting: Podiatry

## 2024-04-13 NOTE — Telephone Encounter (Signed)
 faxed AA [phone] revised RTW date 05/10/24 with same accomm and I emailed the pt for her records. Rtned call she left mess on my vmail

## 2024-04-24 ENCOUNTER — Other Ambulatory Visit: Payer: BC Managed Care – PPO

## 2024-07-03 ENCOUNTER — Telehealth: Payer: Self-pay | Admitting: Family

## 2024-07-03 ENCOUNTER — Encounter: Admitting: Family

## 2024-07-03 ENCOUNTER — Other Ambulatory Visit: Payer: Self-pay

## 2024-07-03 DIAGNOSIS — E782 Mixed hyperlipidemia: Secondary | ICD-10-CM

## 2024-07-03 MED ORDER — ROSUVASTATIN CALCIUM 5 MG PO TABS: 5.0000 mg | ORAL_TABLET | ORAL | 1 refills | Status: AC

## 2024-07-03 NOTE — Telephone Encounter (Signed)
 Rx sent.

## 2024-07-03 NOTE — Telephone Encounter (Signed)
  Encourage patient to contact the pharmacy for refills or they can request refills through [facility]  LAST APPOINTMENT DATE:  03/31/24  NEXT APPOINTMENT DATE:  MEDICATION: rosuvastatin  (CRESTOR ) 5 MG tablet   Is the patient out of medication? Yes  PHARMACY:  [id number] - [place], [place] - [address] [facility] [address] Phone: [phone]  Fax: [phone]      Let patient know to contact pharmacy at the end of the day to make sure medication is ready.  Please notify patient to allow 48-72 hours to process

## 2024-07-04 ENCOUNTER — Ambulatory Visit: Admitting: Obstetrics and Gynecology

## 2024-07-04 ENCOUNTER — Encounter: Payer: Self-pay | Admitting: Obstetrics and Gynecology

## 2024-07-04 VITALS — BP 124/68 | HR 64 | Temp 97.9°F | Ht 66.5 in | Wt 155.0 lb

## 2024-07-04 DIAGNOSIS — Z1331 Encounter for screening for depression: Secondary | ICD-10-CM

## 2024-07-04 DIAGNOSIS — Z01419 Encounter for gynecological examination (general) (routine) without abnormal findings: Secondary | ICD-10-CM

## 2024-07-04 DIAGNOSIS — Z780 Asymptomatic menopausal state: Secondary | ICD-10-CM

## 2024-07-04 NOTE — Patient Instructions (Addendum)
 For patients under 50-61yo, I recommend 1200mg  calcium  daily and at least 600IU of vitamin D  daily. Try to get at least half of your calcium  from your diet and take a 600mg  Calcium  supplement.  Health Maintenance, Female Adopting a healthy lifestyle and getting preventive care are important in promoting health and wellness. Ask your health care provider about: The right schedule for you to have regular tests and exams. Things you can do on your own to prevent diseases and keep yourself healthy. What should I know about diet, weight, and exercise? Eat a healthy diet  Eat a diet that includes plenty of vegetables, fruits, low-fat dairy products, and lean protein. Do not eat a lot of foods that are high in solid fats, added sugars, or sodium. Maintain a healthy weight Body mass index (BMI) is used to identify weight problems. It estimates body fat based on height and weight. Your health care provider can help determine your BMI and help you achieve or maintain a healthy weight. Get regular exercise Get regular exercise. This is one of the most important things you can do for your health. Most adults should: Exercise for at least 150 minutes each week. The exercise should increase your heart rate and make you sweat (moderate-intensity exercise). Do strengthening exercises at least twice a week. This is in addition to the moderate-intensity exercise. Spend less time sitting. Even light physical activity can be beneficial. Watch cholesterol and blood lipids Have your blood tested for lipids and cholesterol at 61 years of age, then have this test every 5 years. Have your cholesterol levels checked more often if: Your lipid or cholesterol levels are high. You are older than 61 years of age. You are at high risk for heart disease. What should I know about cancer screening? Depending on your health history and family history, you may need to have cancer screening at various ages. This may include  screening for: Breast cancer. Cervical cancer. Colorectal cancer. Skin cancer. Lung cancer. What should I know about heart disease, diabetes, and high blood pressure? Blood pressure and heart disease High blood pressure causes heart disease and increases the risk of stroke. This is more likely to develop in people who have high blood pressure readings or are overweight. Have your blood pressure checked: Every 3-5 years if you are 88-38 years of age. Every year if you are 68 years old or older. Diabetes Have regular diabetes screenings. This checks your fasting blood sugar level. Have the screening done: Once every three years after age 7 if you are at a normal weight and have a low risk for diabetes. More often and at a younger age if you are overweight or have a high risk for diabetes. What should I know about preventing infection? Hepatitis B If you have a higher risk for hepatitis B, you should be screened for this virus. Talk with your health care provider to find out if you are at risk for hepatitis B infection. Hepatitis C Testing is recommended for: Everyone born from [birth year] through 1963. Anyone with known risk factors for hepatitis C. Sexually transmitted infections (STIs) Get screened for STIs, including gonorrhea and chlamydia, if: You are sexually active and are younger than 61 years of age. You are older than 61 years of age and your health care provider tells you that you are at risk for this type of infection. Your sexual activity has changed since you were last screened, and you are at increased risk for chlamydia or gonorrhea. Ask  your health care provider if you are at risk. Ask your health care provider about whether you are at high risk for HIV. Your health care provider may recommend a prescription medicine to help prevent HIV infection. If you choose to take medicine to prevent HIV, you should first get tested for HIV. You should then be tested every 3 months for as  long as you are taking the medicine. Osteoporosis and menopause Osteoporosis is a disease in which the bones lose minerals and strength with aging. This can result in bone fractures. If you are 41 years old or older, or if you are at risk for osteoporosis and fractures, ask your health care provider if you should: Be screened for bone loss. Take a calcium  or vitamin D  supplement to lower your risk of fractures. Be given hormone replacement therapy (HRT) to treat symptoms of menopause. Follow these instructions at home: Alcohol use Do not drink alcohol if: Your health care provider tells you not to drink. You are pregnant, may be pregnant, or are planning to become pregnant. If you drink alcohol: Limit how much you have to: 0-1 drink a day. Know how much alcohol is in your drink. In the [place] one drink equals one 12 oz bottle of beer (355 mL), one 5 oz glass of wine (148 mL), or one 1 oz glass of hard liquor (44 mL). Lifestyle Do not use any products that contain nicotine or tobacco. These products include cigarettes, chewing tobacco, and vaping devices, such as e-cigarettes. If you need help quitting, ask your health care provider. Do not use street drugs. Do not share needles. Ask your health care provider for help if you need support or information about quitting drugs. General instructions Schedule regular health, dental, and eye exams. Stay current with your vaccines. Tell your health care provider if: You often feel depressed. You have ever been abused or do not feel safe at home. Summary Adopting a healthy lifestyle and getting preventive care are important in promoting health and wellness. Follow your health care provider's instructions about healthy diet, exercising, and getting tested or screened for diseases. Follow your health care provider's instructions on monitoring your cholesterol and blood pressure. This information is not intended to replace advice given to you by  your health care provider. Make sure you discuss any questions you have with your health care provider. Document Revised: 12/02/2020 Document Reviewed: 12/02/2020 Elsevier Patient Education  2024 [employer].

## 2024-07-04 NOTE — Assessment & Plan Note (Signed)
 Continue vitamin D +Calcium  Encouraged weight based exercise DXA UTD

## 2024-07-04 NOTE — Assessment & Plan Note (Signed)
 Cervical cancer screening performed according to ASCCP guidelines. Encouraged annual mammogram screening Colonoscopy UTD DXA UTD Labs and immunizations with her primary Encouraged safe sexual practices as indicated Encouraged healthy lifestyle practices with diet and exercise For patients under 50-61yo, I recommend 1200mg  calcium daily and 600IU of vitamin D daily.

## 2024-07-04 NOTE — Progress Notes (Signed)
 63 y.o. [id number] postmenopausal female with osteopenia, thyroid  mass (follows with endocrine) here for annual exam. Legally Separated. Flight attendant. PCP: Lucius Krabbe, NP  No complaints. Doing well.  Postmenopausal bleeding: none Pelvic discharge or pain: none Breast mass, nipple discharge or skin changes : none  Last PAP:     Component Value Date/Time   DIAGPAP  05/29/2020 1054    - Negative for intraepithelial lesion or malignancy (NILM)   HPVHIGH Negative 05/29/2020 1054   ADEQPAP  05/29/2020 1054    Satisfactory for evaluation; transformation zone component PRESENT.   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 in [facility]  PHQ-2 Total Score 0     GYN HISTORY: Osteopenia  OB History  Gravida Para Term Preterm AB Living  4 2 2  0 2 2  SAB IAB Ectopic Multiple Live Births  2    2    # Outcome Date GA Lbr Len/2nd Weight Sex Type Anes PTL Lv  4 Term 11/2003    F CS-Classical   LIV  3 Term 06/2002    M Vag-Spont   LIV  2 SAB           1 SAB             Past Medical History:  Diagnosis Date   Chest pressure    Dizziness    Fracture of middle phalanx of finger 10/14/2021   Hypothyroidism    Pain in finger of left hand 10/13/2021   Palpitations    Renal calculi    Thyroid  tumor, 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 Tommas   BREAST CYST ASPIRATION Right    C-section x 1     FOOT SURGERY Left 02/2024    Current Outpatient Medications on File Prior to Visit  Medication Sig Dispense Refill   cholecalciferol (VITAMIN D ) 1000 UNITS tablet Take 1,000 Units by mouth  daily.     diphenhydrAMINE HCl (ALLERGY MEDICATION PO)      loratadine (CLARITIN) 10 MG tablet Take 10 mg by mouth daily.     Magnesium Oxide -Mg Supplement (MAG-200) 200 MG TABS Take 325 mg by mouth daily. 5 a day.     Multiple Vitamin (MULTIVITAMIN ADULT PO)      rosuvastatin  (CRESTOR ) 5 MG tablet Take 1 tablet (5 mg total) by mouth as directed. Start with 1 pill on Monday,Wed, Fri. for 2 weeks, then increase to 1 pill qod x 2w, then 1 pill qd 90 tablet 1   No current facility-administered medications on file prior to visit.    Social History   Socioeconomic History   Marital status: Legally Separated    Spouse name: Not on file   Number of children: Not on file   Years of education: Not on file   Highest education level: Some college, no degree  Occupational History   Not on file  Tobacco Use   Smoking status: Former    Current packs/day: 0.25    Average packs/day: 0.3 packs/day for 20.0 years (5.0 ttl pk-yrs)    Types: Cigarettes   Smokeless tobacco: Never   Tobacco comments:    smoke  occasionally  Vaping Use   Vaping status: Never Used  Substance and Sexual Activity   Alcohol use: Yes    Alcohol/week: 1.0 standard drink of alcohol    Types: 1 Glasses of wine per week   Drug use: No   Sexual activity: Not Currently    Partners: Male    Birth control/protection: Post-menopausal    Comment: husband paralyzed  Other Topics Concern   Not on file  Social History Narrative   Husband has spinal cord injury about January 2012. He is in a wheelchair and disabled.  No bowel / bladder control.   Social Drivers of Corporate Investment Banker Strain: Low Risk  (03/30/2024)   Overall Financial Resource Strain (CARDIA)    Difficulty of Paying Living Expenses: Not hard at all  Food Insecurity: No Food Insecurity (03/30/2024)   Hunger Vital Sign    Worried About Running Out of Food in the Last Year: Never true    Ran Out of Food in the Last Year: Never true  Transportation Needs:  No Transportation Needs (03/30/2024)   PRAPARE - Administrator, Civil Service (Medical): No    Lack of Transportation (Non-Medical): No  Physical Activity: Sufficiently Active (03/30/2024)   Exercise Vital Sign    Days of Exercise per Week: 4 days    Minutes of Exercise per Session: 40 min  Stress: No Stress Concern Present (03/30/2024)   [employer] of Occupational Health - Occupational Stress Questionnaire    Feeling of Stress: Only a little  Social Connections: Moderately Isolated (03/30/2024)   Social Connection and Isolation Panel    Frequency of Communication with Friends and Family: Three times a week    Frequency of Social Gatherings with Friends and Family: Twice a week    Attends Religious Services: More than 4 times per year    Active Member of [employer] or Organizations: No    Attends Engineer, Structural: Not on file    Marital Status: Divorced  Catering Manager Violence: Not on file    Family History  Problem Relation Age of Onset   Osteoporosis Mother    COPD Mother    Bipolar disorder Sister    Bipolar disorder Sister    Breast cancer Neg Hx     No Known Allergies    PE Today's Vitals   07/04/24 1104  BP: 124/68  Pulse: 64  Temp: 97.9 F (36.6 C)  TempSrc: Oral  SpO2: 98%  Weight: 155 lb (70.3 kg)  Height: 5' 6.5 (1.689 m)   Body mass index is 24.64 kg/m.  Physical Exam Vitals reviewed. Exam conducted with a chaperone present.  Constitutional:      General: She is not in acute distress.    Appearance: Normal appearance.  HENT:     Head: Normocephalic and atraumatic.     Nose: Nose normal.  Eyes:     Extraocular Movements: Extraocular movements intact.     Conjunctiva/sclera: Conjunctivae normal.  Pulmonary:     Effort: Pulmonary effort is normal.  Chest:     Chest wall: No mass or tenderness.  Breasts:    Right: Normal. No swelling, mass, nipple discharge, skin change or tenderness.     Left: Normal. No swelling, mass,  nipple discharge, skin change or tenderness.  Abdominal:     General: There is no distension.     Palpations: Abdomen is soft.     Tenderness: There is no abdominal tenderness.  Genitourinary:  Exam position: Lithotomy position.     Urethra: No prolapse.     Vagina: Normal. No vaginal discharge or bleeding.     Cervix: Normal. No lesion.     Uterus: Normal. Not enlarged and not tender.      Adnexa: Right adnexa normal and left adnexa normal.     Comments: +VV atrophy Musculoskeletal:        General: Normal range of motion.     Cervical back: Normal range of motion.  Lymphadenopathy:     Upper Body:     Right upper body: No axillary adenopathy.     Left upper body: No axillary adenopathy.     Lower Body: No right inguinal adenopathy. No left inguinal adenopathy.  Skin:    General: Skin is warm and dry.  Neurological:     General: No focal deficit present.     Mental Status: She is alert.  Psychiatric:        Mood and Affect: Mood normal.        Behavior: Behavior normal.      Assessment and Plan:        Well woman exam with routine gynecological exam Assessment & Plan: Cervical cancer screening performed according to ASCCP guidelines. Encouraged annual mammogram screening Colonoscopy UTD DXA UTD Labs and immunizations with her primary Encouraged safe sexual practices as indicated Encouraged healthy lifestyle practices with diet and exercise For patients under 50-70yo, I recommend 1200mg  calcium  daily and 600IU of vitamin D  daily.    Osteopenia after menopause Assessment & Plan: Continue vitamin D +Calcium  Encouraged weight based exercise DXA UTD    Negative depression screening   Vera LULLA Pa, MD

## 2025-01-03 ENCOUNTER — Encounter: Admitting: Family

## 2025-07-05 ENCOUNTER — Ambulatory Visit: Admitting: Obstetrics and Gynecology
